# Patient Record
Sex: Female | Born: 1955 | Race: White | Hispanic: No | Smoking: Never smoker
Health system: Southern US, Community
[De-identification: ages and names within clinical notes are randomized; demographics above are authoritative.]

## PROBLEM LIST (undated history)

## (undated) DIAGNOSIS — E785 Hyperlipidemia, unspecified: Secondary | ICD-10-CM

## (undated) DIAGNOSIS — M1990 Unspecified osteoarthritis, unspecified site: Secondary | ICD-10-CM

## (undated) DIAGNOSIS — Z8619 Personal history of other infectious and parasitic diseases: Secondary | ICD-10-CM

## (undated) DIAGNOSIS — Z860100 Personal history of colon polyps, unspecified: Secondary | ICD-10-CM

## (undated) DIAGNOSIS — M8580 Other specified disorders of bone density and structure, unspecified site: Secondary | ICD-10-CM

## (undated) DIAGNOSIS — I10 Essential (primary) hypertension: Secondary | ICD-10-CM

## (undated) HISTORY — DX: Hyperlipidemia, unspecified: E78.5

## (undated) HISTORY — DX: Personal history of colon polyps, unspecified: Z86.0100

## (undated) HISTORY — DX: Other specified disorders of bone density and structure, unspecified site: M85.80

## (undated) HISTORY — PX: TOTAL KNEE ARTHROPLASTY: SHX125

## (undated) HISTORY — DX: Personal history of other infectious and parasitic diseases: Z86.19

## (undated) HISTORY — DX: Essential (primary) hypertension: I10

## (undated) HISTORY — PX: TUBAL LIGATION: SHX77

## (undated) HISTORY — DX: Unspecified osteoarthritis, unspecified site: M19.90

## (undated) HISTORY — DX: Personal history of colonic polyps: Z86.010

## (undated) HISTORY — PX: COLONOSCOPY: SHX174

## (undated) HISTORY — PX: JOINT REPLACEMENT: SHX530

---

## 1997-06-13 HISTORY — PX: PATELLAR TENDON REPAIR: SHX737

## 2005-10-11 ENCOUNTER — Ambulatory Visit: Payer: Self-pay | Admitting: Internal Medicine

## 2006-07-13 ENCOUNTER — Ambulatory Visit: Payer: Self-pay | Admitting: Internal Medicine

## 2006-08-24 ENCOUNTER — Ambulatory Visit: Payer: Self-pay | Admitting: Internal Medicine

## 2006-08-24 LAB — CONVERTED CEMR LAB
Cholesterol: 231 mg/dL (ref 0–200)
Glucose, Bld: 89 mg/dL (ref 70–99)
HDL: 52.3 mg/dL (ref >39.0)
Total CHOL/HDL Ratio: 4.4
VLDL: 20 mg/dL (ref 0–40)

## 2006-09-11 ENCOUNTER — Ambulatory Visit: Payer: Self-pay | Admitting: Gastroenterology

## 2006-09-19 ENCOUNTER — Ambulatory Visit: Payer: Self-pay | Admitting: Gastroenterology

## 2006-09-19 ENCOUNTER — Encounter (INDEPENDENT_AMBULATORY_CARE_PROVIDER_SITE_OTHER): Payer: Self-pay | Admitting: Specialist

## 2007-10-04 ENCOUNTER — Encounter: Payer: Self-pay | Admitting: Internal Medicine

## 2007-10-05 ENCOUNTER — Ambulatory Visit: Payer: Self-pay | Admitting: Internal Medicine

## 2007-10-05 DIAGNOSIS — R079 Chest pain, unspecified: Secondary | ICD-10-CM | POA: Insufficient documentation

## 2007-10-05 DIAGNOSIS — Z86010 Personal history of colonic polyps: Secondary | ICD-10-CM | POA: Insufficient documentation

## 2007-10-05 DIAGNOSIS — M1990 Unspecified osteoarthritis, unspecified site: Secondary | ICD-10-CM | POA: Insufficient documentation

## 2007-10-05 DIAGNOSIS — E785 Hyperlipidemia, unspecified: Secondary | ICD-10-CM | POA: Insufficient documentation

## 2007-11-27 ENCOUNTER — Ambulatory Visit: Payer: Self-pay | Admitting: Internal Medicine

## 2007-11-28 LAB — CONVERTED CEMR LAB
ALT: 16 units/L (ref 0–35)
Albumin: 3.9 g/dL (ref 3.5–5.2)
BUN: 15 mg/dL (ref 6–23)
Basophils Relative: 0.3 % (ref 0.0–1.0)
Bilirubin, Direct: 0.1 mg/dL (ref 0.0–0.3)
Eosinophils Absolute: 0.1 10*3/uL (ref 0.0–0.7)
Eosinophils Relative: 1.9 % (ref 0.0–5.0)
GFR calc Af Amer: 113 mL/min
GFR calc non Af Amer: 93 mL/min
HCT: 40 % (ref 36.0–46.0)
Hemoglobin: 14 g/dL (ref 12.0–15.0)
MCV: 92.8 fL (ref 78.0–100.0)
Monocytes Absolute: 0.5 10*3/uL (ref 0.1–1.0)
Monocytes Relative: 7.2 % (ref 3.0–12.0)
Neutro Abs: 3.9 10*3/uL (ref 1.4–7.7)
Phosphorus: 3.5 mg/dL (ref 2.3–4.6)
Platelets: 249 10*3/uL (ref 150–400)
Potassium: 4 meq/L (ref 3.5–5.1)
RBC: 4.31 M/uL (ref 3.87–5.11)
TSH: 0.81 microintl units/mL (ref 0.35–5.50)
Total CHOL/HDL Ratio: 3.9
Total Protein: 6.9 g/dL (ref 6.0–8.3)
WBC: 7 10*3/uL (ref 4.5–10.5)

## 2008-04-24 ENCOUNTER — Ambulatory Visit: Payer: Self-pay | Admitting: Gynecology

## 2008-04-24 ENCOUNTER — Other Ambulatory Visit: Admission: RE | Admit: 2008-04-24 | Discharge: 2008-04-24 | Payer: Self-pay | Admitting: Gynecology

## 2008-04-24 ENCOUNTER — Encounter: Payer: Self-pay | Admitting: Gynecology

## 2008-04-24 ENCOUNTER — Encounter: Payer: Self-pay | Admitting: Internal Medicine

## 2008-04-30 ENCOUNTER — Encounter: Payer: Self-pay | Admitting: Gynecology

## 2008-04-30 ENCOUNTER — Ambulatory Visit: Payer: Self-pay | Admitting: Gynecology

## 2008-05-14 ENCOUNTER — Ambulatory Visit: Payer: Self-pay | Admitting: Gynecology

## 2008-05-27 ENCOUNTER — Ambulatory Visit: Payer: Self-pay | Admitting: Gynecology

## 2008-06-17 ENCOUNTER — Ambulatory Visit: Payer: Self-pay | Admitting: Gynecology

## 2008-06-18 ENCOUNTER — Ambulatory Visit: Payer: Self-pay | Admitting: Gynecology

## 2008-06-19 ENCOUNTER — Ambulatory Visit (HOSPITAL_BASED_OUTPATIENT_CLINIC_OR_DEPARTMENT_OTHER): Admission: RE | Admit: 2008-06-19 | Discharge: 2008-06-19 | Payer: Self-pay | Admitting: Gynecology

## 2008-06-19 ENCOUNTER — Encounter: Payer: Self-pay | Admitting: Gynecology

## 2008-06-19 ENCOUNTER — Ambulatory Visit: Payer: Self-pay | Admitting: Gynecology

## 2008-07-03 ENCOUNTER — Ambulatory Visit: Payer: Self-pay | Admitting: Gynecology

## 2008-07-09 ENCOUNTER — Ambulatory Visit: Payer: Self-pay | Admitting: Family Medicine

## 2008-10-17 ENCOUNTER — Encounter: Payer: Self-pay | Admitting: Internal Medicine

## 2008-10-17 ENCOUNTER — Ambulatory Visit: Payer: Self-pay | Admitting: Gynecology

## 2008-10-28 ENCOUNTER — Ambulatory Visit: Payer: Self-pay | Admitting: Internal Medicine

## 2008-11-21 ENCOUNTER — Ambulatory Visit: Payer: Self-pay | Admitting: Family Medicine

## 2009-01-21 ENCOUNTER — Ambulatory Visit: Payer: Self-pay | Admitting: Internal Medicine

## 2009-01-21 DIAGNOSIS — H9319 Tinnitus, unspecified ear: Secondary | ICD-10-CM | POA: Insufficient documentation

## 2009-04-16 ENCOUNTER — Encounter: Payer: Self-pay | Admitting: Internal Medicine

## 2009-04-22 ENCOUNTER — Encounter: Payer: Self-pay | Admitting: Internal Medicine

## 2009-04-29 ENCOUNTER — Ambulatory Visit: Payer: Self-pay | Admitting: Internal Medicine

## 2009-04-29 DIAGNOSIS — N951 Menopausal and female climacteric states: Secondary | ICD-10-CM | POA: Insufficient documentation

## 2009-05-01 LAB — CONVERTED CEMR LAB
ALT: 18 units/L (ref 0–35)
AST: 17 units/L (ref 0–37)
Albumin: 4.2 g/dL (ref 3.5–5.2)
Alkaline Phosphatase: 70 units/L (ref 39–117)
Basophils Relative: 0.9 % (ref 0.0–3.0)
Bilirubin, Direct: 0.1 mg/dL (ref 0.0–0.3)
CO2: 29 meq/L (ref 19–32)
Chloride: 104 meq/L (ref 96–112)
GFR calc non Af Amer: 79.58 mL/min (ref >60)
HCT: 42.9 % (ref 36.0–46.0)
Hemoglobin: 14.5 g/dL (ref 12.0–15.0)
Lymphocytes Relative: 33.8 % (ref 12.0–46.0)
Monocytes Relative: 6.1 % (ref 3.0–12.0)
Neutro Abs: 4.8 10*3/uL (ref 1.4–7.7)
Phosphorus: 3.6 mg/dL (ref 2.3–4.6)
Potassium: 4.2 meq/L (ref 3.5–5.1)
RBC: 4.52 M/uL (ref 3.87–5.11)
Total CHOL/HDL Ratio: 4
Total Protein: 7.2 g/dL (ref 6.0–8.3)
VLDL: 19 mg/dL (ref 0.0–40.0)

## 2009-07-29 ENCOUNTER — Ambulatory Visit: Payer: Self-pay | Admitting: Gynecology

## 2009-07-29 ENCOUNTER — Other Ambulatory Visit: Admission: RE | Admit: 2009-07-29 | Discharge: 2009-07-29 | Payer: Self-pay | Admitting: Gynecology

## 2010-01-07 ENCOUNTER — Ambulatory Visit: Payer: Self-pay | Admitting: Family Medicine

## 2010-05-03 ENCOUNTER — Ambulatory Visit: Payer: Self-pay | Admitting: Internal Medicine

## 2010-05-03 DIAGNOSIS — J069 Acute upper respiratory infection, unspecified: Secondary | ICD-10-CM | POA: Insufficient documentation

## 2010-07-13 NOTE — Assessment & Plan Note (Signed)
Summary: ST,PAIN RIGHT EAR/CLE   Vital Signs:  Patient profile:   55 year old female Height:      67 inches Weight:      215.50 pounds BMI:     33.87 Temp:     98.3 degrees F oral Pulse rate:   84 / minute Pulse rhythm:   regular BP sitting:   138 / 92  (right arm) Cuff size:   large  Vitals Entered By: Linde Gillis CMA Duncan Dull) (January 07, 2010 2:43 PM) CC:  right ear pain   History of Present Illness: 55 yo with right ear pain. Swimming a lot this summer, right ear hurts to touch and now has sharp shooting pains for past 4 days. No other URI symptoms other than mildly sore throat. No runny nose, fever, cough.    Current Medications (verified): 1)  Pepcid Ac 10 Mg  Tabs (Famotidine) .Marland Kitchen.. 1 Tab By Mouth Daily Prn 2)  Caltrate 600 1500 Mg Tabs (Calcium Carbonate) .... Take One By Mouth Daily 3)  Fish Oil 1200 Mg Caps (Omega-3 Fatty Acids) .... Take 2 By Mouth Daily 4)  Ciprodex 0.3-0.1 % Susp (Ciprofloxacin-Dexamethasone) .... 4 Drops in Affected Ear Two Times A Day  Allergies (verified): No Known Drug Allergies  Review of Systems      See HPI General:  Denies chills and fever. ENT:  Complains of earache; denies decreased hearing and ear discharge. Resp:  Denies cough.  Physical Exam  General:  alert and normal appearance.   Afebrile, nontoxic appearing Ears:  R tragus tender and R canal inflamed.   TMs normal bilaterally Mouth:  no erythema and no lesions.   Lungs:  normal respiratory effort and normal breath sounds.   Heart:  normal rate, regular rhythm, no murmur, and no gallop.   Extremities:  no edema Psych:  normally interactive, good eye contact, not anxious appearing, and not depressed appearing.     Impression & Recommendations:  Problem # 1:  OTITIS EXTERNA, ACUTE (ICD-380.12) Assessment New Ciprodex x 7 days, also discussed swimming precautions. Her updated medication list for this problem includes:    Ciprodex 0.3-0.1 % Susp  (Ciprofloxacin-dexamethasone) .Marland KitchenMarland KitchenMarland KitchenMarland Kitchen 4 drops in affected ear two times a day  Complete Medication List: 1)  Pepcid Ac 10 Mg Tabs (Famotidine) .Marland Kitchen.. 1 tab by mouth daily prn 2)  Caltrate 600 1500 Mg Tabs (Calcium carbonate) .... Take one by mouth daily 3)  Fish Oil 1200 Mg Caps (Omega-3 fatty acids) .... Take 2 by mouth daily 4)  Ciprodex 0.3-0.1 % Susp (Ciprofloxacin-dexamethasone) .... 4 drops in affected ear two times a day Prescriptions: CIPRODEX 0.3-0.1 % SUSP (CIPROFLOXACIN-DEXAMETHASONE) 4 drops in affected ear two times a day  #1 x 0   Entered and Authorized by:   Ruthe Mannan MD   Signed by:   Ruthe Mannan MD on 01/07/2010   Method used:   [facility]        [facility] [facility]retail[facility]       [address]       [place], [place]  [address]       Ph: [phone] or [phone]       Fax: [phone]   RxID:   [id number]   Current Allergies (reviewed today): No known allergies

## 2010-07-13 NOTE — Assessment & Plan Note (Signed)
Summary: Tanya Gomez,EAR/CLE   Vital Signs:  Patient profile:   55 year old female Weight:      220.75 pounds Temp:     98.6 degrees F oral Pulse rate:   84 / minute Pulse rhythm:   regular BP sitting:   156 / 80  (left arm) Cuff size:   large  Vitals Entered By: Selena Batten Dance CMA Duncan Dull) (May 03, 2010 2:09 PM) CC: Sore throat and Right ear pain   History of Present Illness: CC: Tanya Gomez, ear ache  7d h/o R ear pain, Tanya Gomez.  + dry cough.  Mild congestion initially, but not anymore.   + some dizziness if bends over and stands back up fast.  No hearing changes or ringing in ears, draining from ears.  tried mucinex and alkaseltzer cold plus.    No fevers/chills, abd pain, n/v/d, rashes, myalgias, arthralgias  No sick contacts at home.  + granddaughter sick.  No smokers at home.  bp up today, but not feeling great.  never told has HTN in past.  Current Medications (verified): 1)  Pepcid Ac 10 Mg  Tabs (Famotidine) .Marland Kitchen.. 1 Tab By Mouth Daily Prn 2)  Caltrate 600 1500 Mg Tabs (Calcium Carbonate) .... Take One By Mouth Daily 3)  Fish Oil 1200 Mg Caps (Omega-3 Fatty Acids) .... 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   Afebrile, nontoxic appearing Head:  Normocephalic and atraumatic without obvious abnormalities. No apparent alopecia or balding.  sinuses NT Eyes:  PERRLA, EOMI, no injection Ears:  TMs not erythematous or bulging.  + fluid behind ears bilaterally.  R canal with some cotton strands very near TM, unable to remove w/ curette, no alligator forceps available Nose:  External nasal  examination shows no deformity or inflammation. Nasal mucosa are pink and moist without lesions or exudates. Mouth:  Oral mucosa and oropharynx without lesions or exudates.  Teeth in good repair.  MMM Neck:  No deformities, masses, or tenderness noted.  no LAD Lungs:  Normal respiratory effort, chest expands symmetrically. Lungs are clear to auscultation, no crackles or wheezes. Heart:  Normal rate and regular rhythm. S1 and S2 normal without gallop, murmur, click, rub or other extra sounds. Pulses:  2+ rad pulses   Impression & Recommendations:  Problem # 1:  VIRAL URI (ICD-465.9) Tanya Gomez, but no LAD, fevers.  + cough.  likely viral treat supportively.  claritin D for decongestant trial, to call us if ear pain not improving, consider referral to ENT for visualization and removal of cotton although doubt causing sxs.  expect to improve with time.  Her updated medication list for this problem includes:    Claritin-d 24 Hour 10-240 Mg Xr24h-tab (Loratadine-pseudoephedrine) ..... One daily for decongestant.  Complete Medication List: 1)  Pepcid Ac 10 Mg Tabs (Famotidine) .Marland Kitchen.. 1 tab by mouth daily prn 2)  Caltrate 600 1500 Mg Tabs (Calcium carbonate) .... Take one by mouth daily 3)  Fish Oil 1200 Mg Caps (Omega-3 fatty acids) .... Take 2 by mouth daily 4)  Claritin-d 24 Hour 10-240 Mg Xr24h-tab (Loratadine-pseudoephedrine) .... One daily for decongestant.  Patient Instructions: 1)  Sounds like you  have a viral upper respiratory infection. 2)  Antibiotics are not needed for this.  Viral infections usually take 7-10 days to resolve.  The cough can last up to 4 weeks to go away. 3)  Continue alkaseltzer plus.  trial of antihistamine/decongestant. 4)  Let us know if ear pain not improving as expected. 5)  Please return if you are not improving as expected, or if you have high fevers (>101.5) or difficulty breathing/swalowing 6)  Call clinic with questions.  Pleasure to see you today!   Prescriptions: CLARITIN-D 24 HOUR 10-240 MG XR24H-TAB (LORATADINE-PSEUDOEPHEDRINE) one daily for decongestant.  #30 x 0   Entered and Authorized by:   Eustaquio Boyden  MD   Signed by:   Eustaquio Boyden  MD on 05/03/2010   Method used:   [facility]        [id number]       [address]       [place], [place]  [address]       Ph: [phone] or [phone]       Fax: [phone]   RxID:   [id number]    Orders Added: 1)  Est. Patient Level III [36644]    Current Allergies (reviewed today): No known allergies

## 2010-08-12 LAB — HM MAMMOGRAPHY: HM Mammogram: NORMAL

## 2010-10-04 ENCOUNTER — Encounter (INDEPENDENT_AMBULATORY_CARE_PROVIDER_SITE_OTHER): Payer: PRIVATE HEALTH INSURANCE | Admitting: Gynecology

## 2010-10-04 ENCOUNTER — Other Ambulatory Visit (HOSPITAL_COMMUNITY)
Admission: RE | Admit: 2010-10-04 | Discharge: 2010-10-04 | Disposition: A | Discharge status: Home / Self Care | Payer: PRIVATE HEALTH INSURANCE | Source: Ambulatory Visit | Attending: Gynecology | Admitting: Gynecology

## 2010-10-04 ENCOUNTER — Other Ambulatory Visit: Payer: Self-pay | Admitting: Gynecology

## 2010-10-04 DIAGNOSIS — R635 Abnormal weight gain: Secondary | ICD-10-CM

## 2010-10-04 DIAGNOSIS — Z1211 Encounter for screening for malignant neoplasm of colon: Secondary | ICD-10-CM

## 2010-10-04 DIAGNOSIS — Z01419 Encounter for gynecological examination (general) (routine) without abnormal findings: Secondary | ICD-10-CM | POA: Insufficient documentation

## 2010-10-04 DIAGNOSIS — Z13220 Encounter for screening for lipoid disorders: Secondary | ICD-10-CM

## 2010-10-07 ENCOUNTER — Encounter (INDEPENDENT_AMBULATORY_CARE_PROVIDER_SITE_OTHER): Payer: PRIVATE HEALTH INSURANCE

## 2010-10-07 DIAGNOSIS — M899 Disorder of bone, unspecified: Secondary | ICD-10-CM

## 2010-10-07 DIAGNOSIS — E7800 Pure hypercholesterolemia, unspecified: Secondary | ICD-10-CM

## 2010-10-07 DIAGNOSIS — M949 Disorder of cartilage, unspecified: Secondary | ICD-10-CM

## 2010-10-08 ENCOUNTER — Encounter: Payer: Self-pay | Admitting: Internal Medicine

## 2010-10-11 ENCOUNTER — Ambulatory Visit (INDEPENDENT_AMBULATORY_CARE_PROVIDER_SITE_OTHER): Payer: PRIVATE HEALTH INSURANCE | Admitting: Internal Medicine

## 2010-10-11 ENCOUNTER — Encounter: Payer: Self-pay | Admitting: Internal Medicine

## 2010-10-11 VITALS — BP 150/80 | HR 84 | Temp 98.5°F | Ht 67.0 in | Wt 208.0 lb

## 2010-10-11 DIAGNOSIS — E785 Hyperlipidemia, unspecified: Secondary | ICD-10-CM

## 2010-10-11 DIAGNOSIS — I10 Essential (primary) hypertension: Secondary | ICD-10-CM | POA: Insufficient documentation

## 2010-10-11 NOTE — Patient Instructions (Signed)
Please come in for blood work in about 1 month (met b--401.9)

## 2010-10-11 NOTE — Progress Notes (Signed)
  Subjective:    Patient ID: Tanya Gomez, female    DOB: [birth year], 55 y.o.   MRN: [id number]  HPI Saw Dr Lily Peer for routine visit BP 148/102 and stayed mildly elevated over several checks Diagnosed as HTN and started on low dose HCTZ Chol 211---recheck fasting and was told it was still elevated  Not working on exercise Has gained some weight and is restarting Weight Watchers  No chest pain No SOB No sig headaches No edema  Current outpatient prescriptions:Calcium Carbonate (CALTRATE 600 PO), Take 1 tablet by mouth daily.  , Disp: , Rfl: ;  famotidine (PEPCID) 10 MG tablet, Take 10 mg by mouth at bedtime as needed.  , Disp: , Rfl: ;  hydrochlorothiazide (,MICROZIDE/HYDRODIURIL,) 12.5 MG capsule, Take 1 tablet once daily, Disp: , Rfl: ;  loratadine-pseudoephedrine (CLARITIN-D 24-HOUR) 10-240 MG per 24 hr tablet, Take 1 tablet by mouth daily.  , Disp: , Rfl:  Omega-3 Fatty Acids (FISH OIL) 1200 MG CAPS, Take 2 capsules by mouth daily.  , Disp: , Rfl:   Past Medical History  Diagnosis Date  . Hx of colonic polyps     adenomatous  . Hyperlipidemia   . Osteoarthritis     Past Surgical History  Procedure Date  . Vaginal delivery     x2  . Tubal ligation   . Patellar tendon repair 1999    left, spurs removed    Family History  Problem Relation Age of Onset  . Diabetes Mother   . Stroke Mother   . Peripheral vascular disease Mother   . Coronary artery disease Father   . Diabetes Father   . Heart disease Father   . Brain cancer Maternal Grandmother   . Hypertension Neg Hx   . Hearing loss Neg Hx     History   Social History  . Marital Status: Married    Spouse Name: N/A    Number of Children: 2  . 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 Smoking status: Never Smoker   . Smokeless tobacco: Never Used  . Alcohol Use: Yes     occasional  . Drug Use: No  . Sexually Active:  Not on file   Other Topics Concern  . Not on file   Social History Narrative  . No narrative on file   Review of Systems Sleeps okay No mood problems Is a little anxious about BP since Dad had major stroke at 51    Objective:   Physical Exam  Constitutional: She appears well-developed and well-nourished. No distress.  Neck: Normal range of motion. Neck supple. No thyromegaly present.       No carotid bruits  Cardiovascular: Normal rate, regular rhythm, normal heart sounds and intact distal pulses.  Exam reveals no gallop.   No murmur heard. Pulmonary/Chest: Effort normal and breath sounds normal. No respiratory distress. She has no wheezes. She has no rales.  Musculoskeletal: Normal range of motion. She exhibits no edema and no tenderness.  Lymphadenopathy:    She has no cervical adenopathy.  Psychiatric: She has a normal mood and affect. Her behavior is normal. Judgment and thought content normal.          Assessment & Plan:

## 2010-10-13 ENCOUNTER — Encounter: Payer: Self-pay | Admitting: Internal Medicine

## 2010-10-26 NOTE — Op Note (Signed)
NAME:  LASHE, OLIVEIRA              ACCOUNT NO.:  [id number]   MEDICAL RECORD NO.:  [id number]          PATIENT TYPE:  AMB   LOCATION:  [facility]                         FACILITY:  [facility]   PHYSICIAN:  Juan H. Lily Peer, M.D.DATE OF BIRTH:  09/21/1955   DATE OF PROCEDURE:  DATE OF DISCHARGE:                               OPERATIVE REPORT   SURGEON:  Juan H. Lily Peer, MD   INDICATIONS FOR OPERATION:  A 55 year old gravida 2, para 2 with  dysfunctional uterine bleeding and a prolapse submucous myoma.  Sonohysterogram demonstrated additional submucous myomas inside the  intrauterine cavity.  Her endometrial biopsy had remained that reported  a benign endometrial polyp.  The patient is scheduled to undergo  resectoscope myomectomy and polypectomy.   PREOPERATIVE DIAGNOSES:  1. Prolapsing submucous myoma.  2. Endometrial polyps.  3. Submucous myoma.   POSTOPERATIVE DIAGNOSES:  1. Prolapsing submucous myoma.  2. Endometrial polyps.  3. Submucous myoma.   ANESTHESIA:  General endotracheal anesthesia.   PROCEDURE PERFORMED:  1. Diagnostic hysteroscopy.  2. Resectoscopic submucous myomectomy.  3. Resectoscopic polypectomy.   FINDINGS:  The patient had a prolapse anterior uterine wall submucous  myoma and small endometrial polyps found on the fundal region of the  uterus.   DESCRIPTION OF OPERATION:  After the patient was adequately counseled,  she was taken to the operating room where she underwent a successful  general endotracheal anesthesia.  The patient had PSA stockings for DVT  prophylaxis.  She had received a gram of cefotetan preoperatively.  After general endotracheal anesthesia was obtained, she was placed in  high lithotomy position.  The previously placed laminaria was removed  from the cervix.  The vagina, cervix, and perineum were prepped and  draped in usual sterile fashion.  A red rubber Roxan Hockey was used to  evacuate the bladder of its contents for approximately 50  mL to 75 mL.  Bimanual examination demonstrated upper limits of normal anteverted  uterus.  A short-weighted billed speculum was placed in posterior  vaginal wall; the same is retracting the anterior vaginal vault.  The  single-tooth tenaculum was placed in the anterior cervical lip.  The  uterus was found approximately 8 cm required no cervical dilatation.  It  was evident that a prolapsed submucous myoma was present.  With the use  of a ring forceps, it was twisted off its pedicle and passed off for  histological evaluation.  The Lendell Caprice operative resectoscope with a  90 degrees wire loop was introduced into the intrauterine cavity; 3%  sorbitol was in sustained media.  The [facility] surgical  generator was set at 80 watts in a cutting mode and 80 watts on the  coagulation mode, distending media pressure was 100 mmHg.  The operative  resectoscope was introduced into the intrauterine cavity.  The base of  the prolapse submucous myoma was identified in the posterior aspect  lower uterine segment and was transected and remaining stump was excised  and passed off the operative field for histological evaluation.  An  additional submucous myoma was noted on the anterior uterine wall which  was excised with a resectoscope and passed off the operative field for  histological evaluation.  Scattered endometrial polyps and endometrial  cavity were also resected and passed off the operative field as well.  The operating element was switched to a VaporTrode to the bleeding that  was present in a small vessels, and to contain that the VaporTrode then  was set at 100 watts in the coagulation mode and the bleeders were  cauterized.  Preprocedure and postprocedure pictures were obtained.  The  patient tolerated the procedure well.  She was extubated and transferred  to recovery room with stable vital signs.   ESTIMATED BLOOD LOSS:  Minimal.   FLUID RESUSCITATION:  Fluid resuscitation  consisted of 800 mL of  lactated Ringer's.  Fluid deficit of 3% sorbitol was 85 mL.      Juan H. Lily Peer, M.D.  Electronically Signed     JHF/MEDQ  D:  06/19/2008  T:  06/19/2008  Job:  [id number]

## 2010-10-26 NOTE — H&P (Signed)
NAME:  Tanya Gomez, Tanya Gomez              ACCOUNT NO.:  [id number]   MEDICAL RECORD NO.:  [id number]          PATIENT TYPE:  AMB   LOCATION:  [facility]                         [facility]ACILITY[facility]  [facility]   PHYSICIAN:  Juan H. Lily Peer, M.D.DATE OF BIRTH:  12-Jul-1955   DATE OF ADMISSION:  DATE OF DISCHARGE:                              HISTORY & PHYSICAL   The patient is scheduled for surgery on Thursday, January 7th at 08:45  at [facility].  Please have history and physical  available.   CHIEF COMPLAINT:  Submucous myoma and intrauterine polyps.   HISTORY OF PRESENT ILLNESS:  The patient is a 55 year old gravida 2,  para 2 who was seen in the office on November 12th for her annual  gynecological examination as a new patient.  She had been complaining of  a watery discharge and Blood-tinged especially after intercourse.  During examination, it appeared that she had a prolapsed submucous myoma  or polyps.  She returned back to the office.  She underwent a resection  with the [facility] lab electrical surgical generator with a LEEP Unit on  November 18th and a pathology report came benign endometrial polyp.  She  returned for sonohysterogram and it appears she had a second prolapsing  submucous myoma to the level of the external cervical os and  sonohysterogram was done.  She had several intramural myoma, the large  one measuring 12 mm in size.  She had a prominent vascular endometrium;  heterogeneous myometrium suggesting adenomyosis.  Both ovaries were  normal.  The sonohysterogram demonstrated a fundal echogenic focus,  anterior uterine wall measuring 19 mm x 16 mm x 9 mm.  The patient is  scheduled to undergo diagnostic hysteroscopy and resectoscope  polypectomy and myomectomy.   PAST MEDICAL HISTORY:  She has had a prior tubal sterilization procedure  and has had knee surgery as well.   ALLERGIES:  She had denies any allergies.   FAMILY HISTORY:  Diabetes in both parents and husband.   Mother with  hypertension.  Father with cardiovascular disease.   PHYSICAL EXAMINATION:  VITAL SIGNS:  The patient weighs 222 pounds, she  is 5 feet 6-3/4 inches tall, and blood pressure 148/94 on the visit of  November 12th.  HEENT:  Unremarkable.  NECK:  Supple.  Trachea midline.  No carotid bruits.  No thyromegaly.  LUNGS:  Clear to auscultation without rhonchi or wheezes.  HEART:  Regular rate and rhythm.  No murmurs or gallop.  BREASTS:  Exam not done.  ABDOMEN:  Soft and nontender.  No rebound or guarding.  PELVIC:  Bartholin, urethra, and Skene's are in normal limits.  VAGINA AND CERVIX:  The only gross lesion was a prolapsing submucous  myoma.  Bimanual examination was otherwise unremarkable.  RECTAL:  Exam not done.   ASSESSMENT:  A 55 year old gravida 2, para 2 with prolapsing submucous  myoma additional endometrial polyp versus submucous myoma was noted on  sonohysterogram.  The patient is scheduled to undergo diagnostic  hysteroscopy with resectoscope polypectomy and/or myomectomy.  She  underwent placement of laminaria intracervically today to facilitate  insertion  of the operative hysteroscope on the day of her surgery.  She  was given a prescription for pain medication such as Lortab 7.5/500 one  p.o. q. 4-6h p.r.n. pain and Reglan 10 mg one p.o. q. 4-6h p.r.n. nausea  or vomiting.  The risks, benefits, and pros and cons of the procedure  were discussed in detail with the patient.  All questions were answered  and we will follow accordingly.   PLAN:  The patient is scheduled for diagnostic hysteroscopy,  resectoscopic polypectomy, and/or submucous myomectomy on Thursday,  January 7th at 08:45 a.m. at [facility].  Please have  history and physical available.      Juan H. Lily Peer, M.D.  Electronically Signed     JHF/MEDQ  D:  06/18/2008  T:  06/18/2008  Job:  [id number]

## 2010-11-05 ENCOUNTER — Inpatient Hospital Stay (INDEPENDENT_AMBULATORY_CARE_PROVIDER_SITE_OTHER)
Admission: RE | Admit: 2010-11-05 | Discharge: 2010-11-05 | Disposition: A | Discharge status: Home / Self Care | Payer: PRIVATE HEALTH INSURANCE | Source: Ambulatory Visit | Attending: Family Medicine | Admitting: Family Medicine

## 2010-11-05 DIAGNOSIS — H9209 Otalgia, unspecified ear: Secondary | ICD-10-CM

## 2010-11-11 ENCOUNTER — Other Ambulatory Visit: Payer: Self-pay | Admitting: Internal Medicine

## 2010-11-11 ENCOUNTER — Other Ambulatory Visit (INDEPENDENT_AMBULATORY_CARE_PROVIDER_SITE_OTHER): Payer: PRIVATE HEALTH INSURANCE | Admitting: Internal Medicine

## 2010-11-11 DIAGNOSIS — I10 Essential (primary) hypertension: Secondary | ICD-10-CM

## 2010-11-11 LAB — BASIC METABOLIC PANEL
GFR: 80.28 mL/min (ref >60.00)
Glucose, Bld: 90 mg/dL (ref 70–99)
Potassium: 4.5 mEq/L (ref 3.5–5.1)
Sodium: 141 mEq/L (ref 135–145)

## 2011-01-31 ENCOUNTER — Other Ambulatory Visit: Payer: Self-pay | Admitting: Gynecology

## 2011-01-31 MED ORDER — HYDROCHLOROTHIAZIDE 12.5 MG PO CAPS: 12.5000 mg | ORAL_CAPSULE | ORAL | Status: DC

## 2011-01-31 NOTE — Telephone Encounter (Signed)
Pt informed with the below re:rx and to follow up with dr Alphonsus Sias office for hypertension.

## 2011-01-31 NOTE — Telephone Encounter (Signed)
Message copied by Aura Camps on Mon Jan 31, 2011  9:09 AM ------      Message from: Ok Edwards      Created: Mon Jan 31, 2011  9:04 AM       Victorino Dike, please tell patient that I will prescribe her one month of the HCTZ but she needs to followup with her primary physician Dr.Letvak for her chronic hypertension. Dr. Lily Peer

## 2011-03-01 ENCOUNTER — Other Ambulatory Visit: Payer: Self-pay | Admitting: *Deleted

## 2011-03-01 MED ORDER — HYDROCHLOROTHIAZIDE 12.5 MG PO CAPS: ORAL_CAPSULE | ORAL | Status: DC

## 2011-04-12 ENCOUNTER — Encounter: Payer: Self-pay | Admitting: Internal Medicine

## 2011-04-12 ENCOUNTER — Ambulatory Visit (INDEPENDENT_AMBULATORY_CARE_PROVIDER_SITE_OTHER): Payer: PRIVATE HEALTH INSURANCE | Admitting: Internal Medicine

## 2011-04-12 VITALS — BP 138/80 | HR 90 | Temp 98.1°F | Ht 67.0 in | Wt 205.0 lb

## 2011-04-12 DIAGNOSIS — Z23 Encounter for immunization: Secondary | ICD-10-CM

## 2011-04-12 DIAGNOSIS — Z0000 Encounter for general adult medical examination without abnormal findings: Secondary | ICD-10-CM | POA: Insufficient documentation

## 2011-04-12 DIAGNOSIS — L409 Psoriasis, unspecified: Secondary | ICD-10-CM | POA: Insufficient documentation

## 2011-04-12 DIAGNOSIS — L408 Other psoriasis: Secondary | ICD-10-CM

## 2011-04-12 DIAGNOSIS — E785 Hyperlipidemia, unspecified: Secondary | ICD-10-CM

## 2011-04-12 DIAGNOSIS — I10 Essential (primary) hypertension: Secondary | ICD-10-CM

## 2011-04-12 LAB — LIPID PANEL
Cholesterol: 227 mg/dL — ABNORMAL HIGH (ref 0–200)
HDL: 62.6 mg/dL (ref >39.00)
Triglycerides: 109 mg/dL (ref 0.0–149.0)
VLDL: 21.8 mg/dL (ref 0.0–40.0)

## 2011-04-12 LAB — CBC WITH DIFFERENTIAL/PLATELET
Basophils Absolute: 0 10*3/uL (ref 0.0–0.1)
Eosinophils Relative: 0.7 % (ref 0.0–5.0)
Monocytes Absolute: 0.7 10*3/uL (ref 0.1–1.0)
Monocytes Relative: 5.8 % (ref 3.0–12.0)
Neutrophils Relative %: 66.3 % (ref 43.0–77.0)
Platelets: 288 10*3/uL (ref 150.0–400.0)
WBC: 11.4 10*3/uL — ABNORMAL HIGH (ref 4.5–10.5)

## 2011-04-12 LAB — HEPATIC FUNCTION PANEL
AST: 17 U/L (ref 0–37)
Alkaline Phosphatase: 69 U/L (ref 39–117)
Total Bilirubin: 0.4 mg/dL (ref 0.3–1.2)

## 2011-04-12 LAB — BASIC METABOLIC PANEL
BUN: 18 mg/dL (ref 6–23)
Calcium: 9.2 mg/dL (ref 8.4–10.5)
Creatinine, Ser: 0.8 mg/dL (ref 0.4–1.2)
GFR: 80.16 mL/min (ref >60.00)

## 2011-04-12 LAB — LDL CHOLESTEROL, DIRECT: Direct LDL: 180.8 mg/dL

## 2011-04-12 LAB — TSH: TSH: 0.48 u[IU]/mL (ref 0.35–5.50)

## 2011-04-12 NOTE — Assessment & Plan Note (Signed)
Mild She will call with name of Rx and I will refill

## 2011-04-12 NOTE — Assessment & Plan Note (Signed)
BP Readings from Last 3 Encounters:  04/12/11 138/80  10/11/10 150/80  05/03/10 156/80   Good control No changes needed  Due for labs

## 2011-04-12 NOTE — Progress Notes (Signed)
Subjective:    Patient ID: Tanya Gomez, female    DOB: April 11, 1956, 55 y.o.   MRN: [id number]  HPI Doing well Did see Dr Lily Peer this year Had Pap and mammogram  Still on the BP med  No problems with this  Wants pertussis vaccine---will be grandmother again  Current Outpatient Prescriptions on File Prior to Visit  Medication Sig Dispense Refill  . Calcium Carbonate (CALTRATE 600 PO) Take 1 tablet by mouth daily.        . famotidine (PEPCID) 10 MG tablet Take 10 mg by mouth at bedtime as needed.        . hydrochlorothiazide (MICROZIDE) 12.5 MG capsule Take one by mouth daily  30 capsule  1  . loratadine-pseudoephedrine (CLARITIN-D 24-HOUR) 10-240 MG per 24 hr tablet Take 1 tablet by mouth daily.        . Omega-3 Fatty Acids (FISH OIL) 1200 MG CAPS Take 2 capsules by mouth daily.          No Known Allergies  Past Medical History  Diagnosis Date  . Hx of colonic polyps     adenomatous  . Hyperlipidemia   . Osteoarthritis   . Hypertension     Past Surgical History  Procedure Date  . Vaginal delivery     x2  . Tubal ligation   . Patellar tendon repair 1999    left, spurs removed    Family History  Problem Relation Age of Onset  . Diabetes Mother   . Stroke Mother   . Peripheral vascular disease Mother   . Coronary artery disease Father   . Diabetes Father   . Heart disease Father   . Brain cancer Maternal Grandmother   . Hypertension Neg Hx   . Hearing loss Neg Hx     History   Social History  . Marital Status: Married    Spouse Name: N/A    Number of Children: 2  . 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 Smoking status: Never Smoker   . Smokeless tobacco: Never Used  . Alcohol Use: Yes     occasional  . Drug Use: No  . Sexually Active: Not on file   Other Topics Concern  . Not on file   Social History Narrative  . No narrative on file   Review of Systems   Constitutional: Positive for activity change. Negative for fatigue.       Weight is down some Trying to do more exercise Wears seat belt  HENT: Positive for tinnitus. Negative for hearing loss, congestion and rhinorrhea.        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       No diplopia or unilateral vision loss  Respiratory: Negative for cough, chest tightness and shortness of breath.   Cardiovascular: Negative for chest pain, palpitations and leg swelling.  Gastrointestinal: Negative for nausea, vomiting, abdominal pain, constipation and blood in stool.       No sig heartburn  Genitourinary: Negative for urgency, frequency, difficulty urinating and dyspareunia.  Musculoskeletal: Negative for back pain, joint swelling and arthralgias.  Skin: Positive for rash.       Psoriasis on elbows and ankles---saw derm. Had cream but ran out. She will call with the name for refill No suspicious lesions  Neurological: Negative for dizziness, syncope, weakness, light-headedness, numbness  and headaches.  Hematological: Negative for adenopathy. Does not bruise/bleed easily.  Psychiatric/Behavioral: Negative for sleep disturbance and dysphoric mood. The patient is not nervous/anxious.        Objective:   Physical Exam  Constitutional: She is oriented to person, place, and time. She appears well-developed and well-nourished. No distress.  HENT:  Head: Normocephalic and atraumatic.  Right Ear: External ear normal.  Left Ear: External ear normal.  Mouth/Throat: Oropharynx is clear and moist. No oropharyngeal exudate.       TMs normal  Eyes: Conjunctivae and EOM are normal. Pupils are equal, round, and reactive to light.       Fundi benign  Neck: Normal range of motion. Neck supple. No thyromegaly present.  Cardiovascular: Normal rate, regular rhythm, normal heart sounds and intact distal pulses.  Exam reveals no  gallop.   No murmur heard. Pulmonary/Chest: Effort normal and breath sounds normal. No respiratory distress. She has no wheezes. She has no rales.  Abdominal: Soft. There is no tenderness.  Musculoskeletal: Normal range of motion. She exhibits no edema and no tenderness.  Lymphadenopathy:    She has no cervical adenopathy.  Neurological: She is alert and oriented to person, place, and time.  Skin: Skin is warm. No rash noted. No erythema.  Psychiatric: She has a normal mood and affect. Her behavior is normal. Judgment and thought content normal.          Assessment & Plan:

## 2011-04-12 NOTE — Assessment & Plan Note (Signed)
Discussed Rx Will hold off on labs but may want to reconsider statins in the future

## 2011-04-12 NOTE — Assessment & Plan Note (Signed)
Generally healthy Working on fitness UTD on cancer screening Flu shot given Did have Tdap in 2008

## 2011-05-02 ENCOUNTER — Other Ambulatory Visit: Payer: Self-pay | Admitting: Internal Medicine

## 2011-08-16 ENCOUNTER — Encounter: Payer: Self-pay | Admitting: Gastroenterology

## 2011-08-18 ENCOUNTER — Telehealth: Payer: Self-pay | Admitting: Internal Medicine

## 2011-08-18 MED ORDER — CLOBETASOL PROPIONATE 0.05 % EX CREA: TOPICAL_CREAM | Freq: Two times a day (BID) | CUTANEOUS | Status: AC | PRN

## 2011-08-18 NOTE — Telephone Encounter (Signed)
Husband in She needs refill of clobetasol 0.05% which she uses occ Will refill

## 2011-10-07 ENCOUNTER — Encounter: Payer: Self-pay | Admitting: Internal Medicine

## 2011-10-07 ENCOUNTER — Ambulatory Visit (INDEPENDENT_AMBULATORY_CARE_PROVIDER_SITE_OTHER): Payer: PRIVATE HEALTH INSURANCE | Admitting: Internal Medicine

## 2011-10-07 VITALS — BP 130/90 | HR 64 | Temp 98.3°F | Wt 216.2 lb

## 2011-10-07 DIAGNOSIS — J019 Acute sinusitis, unspecified: Secondary | ICD-10-CM

## 2011-10-07 MED ORDER — AMOXICILLIN 500 MG PO TABS: 1000.0000 mg | ORAL_TABLET | Freq: Two times a day (BID) | ORAL | Status: AC

## 2011-10-07 NOTE — Progress Notes (Signed)
  Subjective:    Patient ID: Tanya Gomez, female    DOB: [birth year], 56 y.o.   MRN: [id number]  HPI Left ear has been hurting for a couple of weeks Right maxillary pain as well  Uses mucinex sinus max and advil sinus and that gives temporary relief No itchy eyes or nose Some sneezing Notes post nasal drip at night Some cough with swallowed mucus  No fever Sore throat last week---better now No SOB  Current Outpatient Prescriptions on File Prior to Visit  Medication Sig Dispense Refill  . Calcium Carbonate (CALTRATE 600 PO) Take 1 tablet by mouth daily.        . clobetasol cream (TEMOVATE) 0.05 % Apply topically 2 (two) times daily as needed.  30 g  1  . famotidine (PEPCID) 10 MG tablet Take 10 mg by mouth at bedtime as needed.        . hydrochlorothiazide (MICROZIDE) 12.5 MG capsule Take 1 capsule (12.5 mg total) by mouth every morning.  30 capsule  11  . loratadine-pseudoephedrine (CLARITIN-D 24-HOUR) 10-240 MG per 24 hr tablet Take 1 tablet by mouth daily.          No Known Allergies  Past Medical History  Diagnosis Date  . Hx of colonic polyps     adenomatous  . Hyperlipidemia   . Osteoarthritis   . Hypertension     Past Surgical History  Procedure Date  . Vaginal delivery     x2  . Tubal ligation   . Patellar tendon repair 1999    left, spurs removed    Family History  Problem Relation Age of Onset  . Diabetes Mother   . Stroke Mother   . Peripheral vascular disease Mother   . Coronary artery disease Father   . Diabetes Father   . Heart disease Father   . Brain cancer Maternal Grandmother   . Hypertension Neg Hx   . Hearing loss Neg Hx     History   Social History  . Marital Status: Married    Spouse Name: N/A    Number of Children: 2  . 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 Smoking status: Never Smoker   . Smokeless tobacco: Never Used  . Alcohol Use:  Yes     occasional  . Drug Use: No  . Sexually Active: Not on file   Other Topics Concern  . Not on file   Social History Narrative  . No narrative on file   Review of Systems No history of seasonal allergies Weight is up 10# since physical    Objective:   Physical Exam  Constitutional: She appears well-developed and well-nourished. No distress.  HENT:       TMs normal Moderate nasal swelling with some opaque mucus Mild left maxillary tenderness  Neck: Normal range of motion. Neck supple.  Pulmonary/Chest: Effort normal and breath sounds normal. No respiratory distress. She has no wheezes. She has no rales.  Lymphadenopathy:    She has no cervical adenopathy.          Assessment & Plan:

## 2011-10-07 NOTE — Assessment & Plan Note (Signed)
Has had 2 weeks of symptoms Ear pain seems secondary Will treat with amoxil given time course (and son is getting married next week) Continue symptom management

## 2012-04-11 ENCOUNTER — Encounter: Payer: Self-pay | Admitting: Internal Medicine

## 2012-04-11 ENCOUNTER — Ambulatory Visit (INDEPENDENT_AMBULATORY_CARE_PROVIDER_SITE_OTHER): Payer: Self-pay | Admitting: Internal Medicine

## 2012-04-11 VITALS — BP 128/78 | HR 86 | Temp 98.0°F | Ht 67.0 in | Wt 217.0 lb

## 2012-04-11 DIAGNOSIS — I10 Essential (primary) hypertension: Secondary | ICD-10-CM

## 2012-04-11 DIAGNOSIS — E669 Obesity, unspecified: Secondary | ICD-10-CM | POA: Insufficient documentation

## 2012-04-11 DIAGNOSIS — Z0000 Encounter for general adult medical examination without abnormal findings: Secondary | ICD-10-CM

## 2012-04-11 DIAGNOSIS — E785 Hyperlipidemia, unspecified: Secondary | ICD-10-CM

## 2012-04-11 DIAGNOSIS — Z23 Encounter for immunization: Secondary | ICD-10-CM

## 2012-04-11 LAB — BASIC METABOLIC PANEL
BUN: 15 mg/dL (ref 6–23)
Chloride: 106 mEq/L (ref 96–112)
Creatinine, Ser: 0.8 mg/dL (ref 0.4–1.2)
Glucose, Bld: 91 mg/dL (ref 70–99)
Potassium: 3.9 mEq/L (ref 3.5–5.1)

## 2012-04-11 LAB — LIPID PANEL
Cholesterol: 238 mg/dL — ABNORMAL HIGH (ref 0–200)
VLDL: 22 mg/dL (ref 0.0–40.0)

## 2012-04-11 LAB — HEPATIC FUNCTION PANEL
AST: 24 U/L (ref 0–37)
Albumin: 4 g/dL (ref 3.5–5.2)
Total Bilirubin: 0.6 mg/dL (ref 0.3–1.2)

## 2012-04-11 LAB — CBC WITH DIFFERENTIAL/PLATELET
Basophils Absolute: 0.1 10*3/uL (ref 0.0–0.1)
Eosinophils Absolute: 0.1 10*3/uL (ref 0.0–0.7)
HCT: 43.7 % (ref 36.0–46.0)
Lymphs Abs: 2.8 10*3/uL (ref 0.7–4.0)
MCHC: 33.5 g/dL (ref 30.0–36.0)
MCV: 93.3 fl (ref 78.0–100.0)
Monocytes Absolute: 0.6 10*3/uL (ref 0.1–1.0)
Neutrophils Relative %: 57.4 % (ref 43.0–77.0)
Platelets: 277 10*3/uL (ref 150.0–400.0)
RDW: 12.4 % (ref 11.5–14.6)

## 2012-04-11 LAB — TSH: TSH: 0.95 u[IU]/mL (ref 0.35–5.50)

## 2012-04-11 LAB — LDL CHOLESTEROL, DIRECT: Direct LDL: 179.6 mg/dL

## 2012-04-11 NOTE — Assessment & Plan Note (Signed)
Counseled on lifestyle

## 2012-04-11 NOTE — Assessment & Plan Note (Signed)
Discussed primary prevention again If LDL still high (last year 161), will start atorvastatin 20

## 2012-04-11 NOTE — Assessment & Plan Note (Signed)
Generally healthy but really needs to work on fitness and diet Discussed starting Weight Watchers again Gym mammo due next spring

## 2012-04-11 NOTE — Assessment & Plan Note (Signed)
BP Readings from Last 3 Encounters:  04/11/12 128/78  10/07/11 130/90  04/12/11 138/80   Reasonable control No changes needed

## 2012-04-11 NOTE — Progress Notes (Signed)
Subjective:    Patient ID: Tanya Gomez, female    DOB: June 20, 1955, 56 y.o.   MRN: [id number]  HPI Here for physical No new problems  Still sees Dr Lily Peer for gyn Did have normal DEXA already  Was doing some exercise--at gym (class and elliptical) Got off track and hopes to restart No real dietary control--but has done Weight Watchers  Current Outpatient Prescriptions on File Prior to Visit  Medication Sig Dispense Refill  . Calcium Carbonate (CALTRATE 600 PO) Take 1 tablet by mouth daily.        . clobetasol cream (TEMOVATE) 0.05 % Apply topically 2 (two) times daily as needed.  30 g  1  . hydrochlorothiazide (MICROZIDE) 12.5 MG capsule Take 1 capsule (12.5 mg total) by mouth every morning.  30 capsule  11    No Known Allergies  Past Medical History  Diagnosis Date  . Hx of colonic polyps     adenomatous  . Hyperlipidemia   . Osteoarthritis   . Hypertension     Past Surgical History  Procedure Date  . Vaginal delivery     x2  . Tubal ligation   . Patellar tendon repair 1999    left, spurs removed    Family History  Problem Relation Age of Onset  . Diabetes Mother   . Stroke Mother   . Peripheral vascular disease Mother   . Coronary artery disease Father   . Diabetes Father   . Heart disease Father   . Brain cancer Maternal Grandmother   . Hypertension Neg Hx   . Hearing loss Neg Hx     History   Social History  . Marital Status: Married    Spouse Name: N/A    Number of Children: 2  . 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 Smoking status: Never Smoker   . Smokeless tobacco: Never Used  . Alcohol Use: Yes     occasional  . Drug Use: No  . Sexually Active: Not on file   Other Topics Concern  . Not on file   Social History Narrative  . No narrative on file   Review of Systems  Constitutional: Negative for fatigue and unexpected weight change.   Wears seat belt  HENT: Positive for hearing loss, congestion, rhinorrhea and tinnitus.        Chronic static Regular with dentist  Eyes: Negative for visual disturbance.       No diplopia or unilateral vision loss  Respiratory: Negative for cough, chest tightness and shortness of breath.   Cardiovascular: Negative for chest pain, palpitations and leg swelling.  Gastrointestinal: Negative for nausea, vomiting, abdominal pain, constipation and blood in stool.       No heartburn  Genitourinary: Negative for urgency, frequency, difficulty urinating and dyspareunia.       No incontinence  Musculoskeletal: Negative for back pain, joint swelling and arthralgias.       Right 2nd toe lump on DIP  Skin: Positive for rash.       No suspicious areas Still with psoriasis--uses cream on feet  Neurological: Negative for dizziness, syncope, weakness, light-headedness, numbness and headaches.  Hematological: Negative for adenopathy. Does not bruise/bleed easily.  Psychiatric/Behavioral: Negative for disturbed wake/sleep cycle and dysphoric mood. The patient is not nervous/anxious.        Objective:   Physical Exam  Constitutional: She is oriented to person, place, and  time. She appears well-developed and well-nourished. No distress.  HENT:  Head: Normocephalic and atraumatic.  Right Ear: External ear normal.  Left Ear: External ear normal.  Mouth/Throat: Oropharynx is clear and moist. No oropharyngeal exudate.  Eyes: Conjunctivae normal and EOM are normal. Pupils are equal, round, and reactive to light.  Neck: Normal range of motion. Neck supple. No thyromegaly present.  Cardiovascular: Normal rate, regular rhythm, normal heart sounds and intact distal pulses.  Exam reveals no gallop.   No murmur heard. Pulmonary/Chest: Effort normal and breath sounds normal. No respiratory distress. She has no wheezes. She has no rales.  Abdominal: Soft. There is no tenderness.  Musculoskeletal: She exhibits no  edema and no tenderness.  Lymphadenopathy:    She has no cervical adenopathy.  Neurological: She is alert and oriented to person, place, and time.  Skin: No rash noted. No erythema.  Psychiatric: She has a normal mood and affect. Her behavior is normal.          Assessment & Plan:

## 2012-04-11 NOTE — Patient Instructions (Signed)
Please start weight watchers again

## 2012-04-23 ENCOUNTER — Encounter: Payer: Self-pay | Admitting: *Deleted

## 2012-04-23 MED ORDER — ATORVASTATIN CALCIUM 20 MG PO TABS: 20.0000 mg | ORAL_TABLET | Freq: Every day | ORAL | Status: DC

## 2012-05-01 ENCOUNTER — Other Ambulatory Visit: Payer: Self-pay | Admitting: Internal Medicine

## 2012-09-17 ENCOUNTER — Encounter: Payer: Self-pay | Admitting: Gastroenterology

## 2013-03-29 ENCOUNTER — Other Ambulatory Visit: Payer: Self-pay | Admitting: Internal Medicine

## 2013-04-12 ENCOUNTER — Encounter: Payer: Self-pay | Admitting: Internal Medicine

## 2013-04-12 ENCOUNTER — Ambulatory Visit (INDEPENDENT_AMBULATORY_CARE_PROVIDER_SITE_OTHER): Payer: BC Managed Care – PPO | Admitting: Internal Medicine

## 2013-04-12 VITALS — BP 136/90 | HR 76 | Temp 97.8°F | Ht 67.0 in | Wt 217.5 lb

## 2013-04-12 DIAGNOSIS — L408 Other psoriasis: Secondary | ICD-10-CM

## 2013-04-12 DIAGNOSIS — E785 Hyperlipidemia, unspecified: Secondary | ICD-10-CM

## 2013-04-12 DIAGNOSIS — Z23 Encounter for immunization: Secondary | ICD-10-CM

## 2013-04-12 DIAGNOSIS — L409 Psoriasis, unspecified: Secondary | ICD-10-CM

## 2013-04-12 DIAGNOSIS — Z0000 Encounter for general adult medical examination without abnormal findings: Secondary | ICD-10-CM

## 2013-04-12 DIAGNOSIS — I10 Essential (primary) hypertension: Secondary | ICD-10-CM

## 2013-04-12 LAB — BASIC METABOLIC PANEL
BUN: 16 mg/dL (ref 6–23)
CO2: 29 mEq/L (ref 19–32)
Glucose, Bld: 98 mg/dL (ref 70–99)
Potassium: 4.1 mEq/L (ref 3.5–5.1)
Sodium: 139 mEq/L (ref 135–145)

## 2013-04-12 LAB — CBC WITH DIFFERENTIAL/PLATELET
Basophils Absolute: 0 10*3/uL (ref 0.0–0.1)
Eosinophils Absolute: 0.2 10*3/uL (ref 0.0–0.7)
HCT: 43.1 % (ref 36.0–46.0)
Hemoglobin: 14.7 g/dL (ref 12.0–15.0)
Lymphs Abs: 3 10*3/uL (ref 0.7–4.0)
MCHC: 34 g/dL (ref 30.0–36.0)
MCV: 90.9 fl (ref 78.0–100.0)
Monocytes Absolute: 0.7 10*3/uL (ref 0.1–1.0)
Neutro Abs: 5 10*3/uL (ref 1.4–7.7)
Platelets: 298 10*3/uL (ref 150.0–400.0)
RDW: 12 % (ref 11.5–14.6)

## 2013-04-12 LAB — HEPATIC FUNCTION PANEL
Albumin: 4.3 g/dL (ref 3.5–5.2)
Alkaline Phosphatase: 71 U/L (ref 39–117)
Total Protein: 7.7 g/dL (ref 6.0–8.3)

## 2013-04-12 LAB — TSH: TSH: 0.82 u[IU]/mL (ref 0.35–5.50)

## 2013-04-12 LAB — LIPID PANEL
Cholesterol: 144 mg/dL (ref 0–200)
VLDL: 20.4 mg/dL (ref 0.0–40.0)

## 2013-04-12 NOTE — Progress Notes (Signed)
Subjective:    Patient ID: Tanya Gomez, female    DOB: Aug 28, 1955, 57 y.o.   MRN: [id number]  HPI Here for physical  Did start the atorvastatin No problems with this Never got the blood work checked  Doesn't check BP Has restarted Weight Watchers---lost 5# in past month (had gained weight) Trying to walk regularly  Saw dermatologist for her plaque psoriasis on feet and hands Has 2 creams to use  Current Outpatient Prescriptions on File Prior to Visit  Medication Sig Dispense Refill  . atorvastatin (LIPITOR) 20 MG tablet TAKE 1 TABLET BY MOUTH DAILY.  90 tablet  3  . hydrochlorothiazide (MICROZIDE) 12.5 MG capsule TAKE 1 CAPSULE BY MOUTH EVERY MORNING  30 capsule  11   No current facility-administered medications on file prior to visit.    No Known Allergies  Past Medical History  Diagnosis Date  . Hx of colonic polyps     adenomatous  . Hyperlipidemia   . Osteoarthritis   . Hypertension     Past Surgical History  Procedure Laterality Date  . Vaginal delivery      x2  . Tubal ligation    . Patellar tendon repair  1999    left, spurs removed    Family History  Problem Relation Age of Onset  . Diabetes Mother   . Stroke Mother   . Peripheral vascular disease Mother   . Coronary artery disease Father   . Diabetes Father   . Heart disease Father   . Brain cancer Maternal Grandmother   . Hypertension Neg Hx   . Hearing loss Neg Hx     History   Social History  . Marital Status: Married    Spouse Name: N/A    Number of Children: 2  . 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 Smoking status: Never Smoker   . Smokeless tobacco: Never Used  . Alcohol Use: Yes     Comment: occasional  . Drug Use: No  . Sexual Activity: Not on file   Other Topics Concern  . Not on file   Social History Narrative  . No narrative on file   Review of Systems  Constitutional:  Negative for fatigue and unexpected weight change.       Wears seat belt  HENT: Positive for tinnitus. Negative for congestion, dental problem, hearing loss and rhinorrhea.        Right ear tinnitus Regular with dentist  Eyes: Negative for visual disturbance.       No diplopia or unilateral vision loss  Respiratory: Negative for cough, chest tightness and shortness of breath.   Cardiovascular: Negative for chest pain, palpitations and leg swelling.  Gastrointestinal: Negative for nausea, vomiting, abdominal pain, constipation and blood in stool.       No heartburn  Endocrine: Negative for cold intolerance and heat intolerance.  Genitourinary: Negative for dysuria, hematuria, difficulty urinating and dyspareunia.       Sees Dr Lily Peer still  Musculoskeletal: Negative for arthralgias, back pain and joint swelling.  Skin: Positive for rash.       Sees derm   Allergic/Immunologic: Negative for environmental allergies and immunocompromised state.  Neurological: Negative for dizziness, syncope, weakness, light-headedness, numbness and headaches.  Hematological: Negative for adenopathy. Does not bruise/bleed easily.  Psychiatric/Behavioral: Negative for sleep disturbance and dysphoric mood. The patient is not nervous/anxious.  Objective:   Physical Exam  Constitutional: She is oriented to person, place, and time. She appears well-developed and well-nourished. No distress.  HENT:  Head: Normocephalic and atraumatic.  Right Ear: External ear normal.  Left Ear: External ear normal.  Mouth/Throat: Oropharynx is clear and moist. No oropharyngeal exudate.  Eyes: Conjunctivae are normal. Pupils are equal, round, and reactive to light.  Neck: Normal range of motion. Neck supple. No thyromegaly present.  Cardiovascular: Normal rate, regular rhythm, normal heart sounds and intact distal pulses.  Exam reveals no gallop.   No murmur heard. Pulmonary/Chest: Effort normal and breath sounds  normal. No respiratory distress. She has no wheezes. She has no rales.  Abdominal: Soft. There is no tenderness.  Musculoskeletal: She exhibits no edema and no tenderness.  Lymphadenopathy:    She has no cervical adenopathy.    She has no axillary adenopathy.  Neurological: She is alert and oriented to person, place, and time.  Skin: Rash noted.  Psoriatic rash on hands and feet  Psychiatric: She has a normal mood and affect. Her behavior is normal.          Assessment & Plan:

## 2013-04-12 NOTE — Assessment & Plan Note (Signed)
Healthy Rededicating herself to fitness efforts---discussed UTD on preventative care

## 2013-04-12 NOTE — Addendum Note (Signed)
Addended by: Shon Millet on: 04/12/2013 09:20 AM   Modules accepted: Orders

## 2013-04-12 NOTE — Assessment & Plan Note (Signed)
No problems with the atorvastatin Will check labs

## 2013-04-12 NOTE — Assessment & Plan Note (Signed)
Sees the dermatologist

## 2013-04-12 NOTE — Assessment & Plan Note (Signed)
BP Readings from Last 3 Encounters:  04/12/13 136/90  04/11/12 128/78  10/07/11 130/90   Reasonable control

## 2013-05-30 ENCOUNTER — Other Ambulatory Visit: Payer: Self-pay | Admitting: Internal Medicine

## 2013-08-20 ENCOUNTER — Encounter: Payer: Self-pay | Admitting: Gynecology

## 2013-08-20 ENCOUNTER — Ambulatory Visit (INDEPENDENT_AMBULATORY_CARE_PROVIDER_SITE_OTHER): Payer: BC Managed Care – PPO | Admitting: Gynecology

## 2013-08-20 ENCOUNTER — Other Ambulatory Visit (HOSPITAL_COMMUNITY)
Admission: RE | Admit: 2013-08-20 | Discharge: 2013-08-20 | Disposition: A | Discharge status: Home / Self Care | Payer: BC Managed Care – PPO | Source: Ambulatory Visit | Attending: Gynecology | Admitting: Gynecology

## 2013-08-20 VITALS — BP 144/94 | Ht 66.5 in | Wt 228.0 lb

## 2013-08-20 DIAGNOSIS — N951 Menopausal and female climacteric states: Secondary | ICD-10-CM

## 2013-08-20 DIAGNOSIS — M899 Disorder of bone, unspecified: Secondary | ICD-10-CM

## 2013-08-20 DIAGNOSIS — Z780 Asymptomatic menopausal state: Secondary | ICD-10-CM

## 2013-08-20 DIAGNOSIS — Z1151 Encounter for screening for human papillomavirus (HPV): Secondary | ICD-10-CM | POA: Insufficient documentation

## 2013-08-20 DIAGNOSIS — M8580 Other specified disorders of bone density and structure, unspecified site: Secondary | ICD-10-CM

## 2013-08-20 DIAGNOSIS — M949 Disorder of cartilage, unspecified: Secondary | ICD-10-CM

## 2013-08-20 DIAGNOSIS — Z01419 Encounter for gynecological examination (general) (routine) without abnormal findings: Secondary | ICD-10-CM

## 2013-08-20 NOTE — Patient Instructions (Signed)
Bone Densitometry Bone densitometry is a special X-ray that measures your bone density and can be used to help predict your risk of bone fractures. This test is used to determine bone mineral content and density to diagnose osteoporosis. Osteoporosis is the loss of bone that may cause the bone to become weak. Osteoporosis commonly occurs in women entering menopause. However, it may be found in men and in people with other diseases. PREPARATION FOR TEST No preparation necessary. WHO SHOULD BE TESTED?  All women older than 65.  Postmenopausal women (50 to 65) with risk factors for osteoporosis.  People with a previous fracture caused by normal activities.  People with a small body frame (less than 127 poundsor a body mass index [BMI] of less than 21).  People who have a parent with a hip fracture or history of osteoporosis.  People who smoke.  People who have rheumatoid arthritis.  Anyone who engages in excessive alcohol use (more than 3 drinks most days).  Women who experience early menopause. WHEN SHOULD YOU BE RETESTED? Current guidelines suggest that you should wait at least 2 years before doing a bone density test again if your first test was normal.Recent studies indicated that women with normal bone density may be able to wait a few years before needing to repeat a bone density test. You should discuss this with your caregiver.  NORMAL FINDINGS   Normal: less than standard deviation below normal (greater than -1).  Osteopenia: 1 to 2.5 standard deviations below normal (-1 to -2.5).  Osteoporosis: greater than 2.5 standard deviations below normal (less than -2.5). Test results are reported as a "T score" and a "Z score."The T score is a number that compares your bone density with the bone density of healthy, young women.The Z score is a number that compares your bone density with the scores of women who are the same age, gender, and race.  Ranges for normal findings may vary  among different laboratories and hospitals. You should always check with your doctor after having lab work or other tests done to discuss the meaning of your test results and whether your values are considered within normal limits. MEANING OF TEST  Your caregiver will go over the test results with you and discuss the importance and meaning of your results, as well as treatment options and the need for additional tests if necessary. OBTAINING THE TEST RESULTS It is your responsibility to obtain your test results. Ask the lab or department performing the test when and how you will get your results. Document Released: 06/21/2004 Document Revised: 08/22/2011 Document Reviewed: 07/14/2010 [employer] Patient Information 2014 [employer].  

## 2013-08-20 NOTE — Progress Notes (Signed)
Tanya Gomez 11-Mar-1956 [id number]   History:    58 y.o.  for annual gyn exam was not been seen in the office is 2012. Patient has been menopausal for several years and has not been on hormone replacement therapy and is asymptomatic today. Her primary physician is Dr. Silvio Pate who has been treating her for her hyperlipidemia. Patient with known history of colon polyps. Her last colonoscopy was in 2008 and she is overdue for her colonoscopy. Her last bone density study was in 2012 her lowest T score was -1.1 at the right femoral neck with normal FRAX analysis. Patient with prior tubal ligation. Patient with no prior history of abnormal Pap smears.  Past medical history,surgical history, family history and social history were all reviewed and documented in the EPIC chart.  Gynecologic History No LMP recorded. Patient is postmenopausal. Contraception: post menopausal status Last Pap: 2012. Results were: normal Last mammogram: 2012. Results were: normal  Obstetric History OB History  Gravida Para Term Preterm AB SAB TAB Ectopic Multiple Living  2 2        2     # Outcome Date GA Lbr Len/2nd Weight Sex Delivery Anes PTL Lv  2 PAR           1 PAR                ROS: A ROS was performed and pertinent positives and negatives are included in the history.  GENERAL: No fevers or chills. HEENT: No change in vision, no earache, sore throat or sinus congestion. NECK: No pain or stiffness. CARDIOVASCULAR: No chest pain or pressure. No palpitations. PULMONARY: No shortness of breath, cough or wheeze. GASTROINTESTINAL: No abdominal pain, nausea, vomiting or diarrhea, melena or bright red blood per rectum. GENITOURINARY: No urinary frequency, urgency, hesitancy or dysuria. MUSCULOSKELETAL: No joint or muscle pain, no back pain, no recent trauma. DERMATOLOGIC: No rash, no itching, no lesions. ENDOCRINE: No polyuria, polydipsia, no heat or cold intolerance. No recent change in weight. HEMATOLOGICAL: No  anemia or easy bruising or bleeding. NEUROLOGIC: No headache, seizures, numbness, tingling or weakness. PSYCHIATRIC: No depression, no loss of interest in normal activity or change in sleep pattern.     Exam: chaperone present  BP 144/94  Ht 5' 6.5" (1.689 m)  Wt 228 lb (103.42 kg)  BMI 36.25 kg/m2  Body mass index is 36.25 kg/(m^2).  General appearance : Well developed well nourished female. No acute distress HEENT: Neck supple, trachea midline, no carotid bruits, no thyroidmegaly Lungs: Clear to auscultation, no rhonchi or wheezes, or rib retractions  Heart: Regular rate and rhythm, no murmurs or gallops Breast:Examined in sitting and supine position were symmetrical in appearance, no palpable masses or tenderness,  no skin retraction, no nipple inversion, no nipple discharge, no skin discoloration, no axillary or supraclavicular lymphadenopathy Abdomen: no palpable masses or tenderness, no rebound or guarding Extremities: no edema or skin discoloration or tenderness  Pelvic:  Bartholin, Urethra, Skene Glands: Within normal limits             Vagina: No gross lesions or discharge  Cervix: No gross lesions or discharge  Uterus  anteverted, normal size, shape and consistency, non-tender and mobile  Adnexa  Without masses or tenderness  Anus and perineum  normal   Rectovaginal  normal sphincter tone without palpated masses or tenderness             Hemoccult PCP provides     Assessment/Plan:  58 y.o. female  for annual exam who has not been seen in the office in 2012. Patient menopausal but no vision motor symptoms. Pap smear was done today in accordance to the new guidelines. PCP has been drawn her blood work. We will schedule her for a bone density study here in our office in the next 2 weeks. She was reminded to schedule her mammogram since it is overdue. Discussed importance of calcium and vitamin D in regular exercise for osteoporosis prevention. We also discussed importance of  monthly breast exam.  Note: This dictation was prepared with  Dragon/digital dictation along withSmart phrase technology. Any transcriptional errors that result from this process are unintentional.   Terrance Mass MD, 11:42 AM 08/20/2013

## 2013-09-02 ENCOUNTER — Encounter: Payer: Self-pay | Admitting: Gynecology

## 2013-09-12 ENCOUNTER — Ambulatory Visit (INDEPENDENT_AMBULATORY_CARE_PROVIDER_SITE_OTHER): Payer: BC Managed Care – PPO

## 2013-09-12 DIAGNOSIS — M8580 Other specified disorders of bone density and structure, unspecified site: Secondary | ICD-10-CM

## 2013-09-12 DIAGNOSIS — N951 Menopausal and female climacteric states: Secondary | ICD-10-CM

## 2013-09-12 DIAGNOSIS — Z780 Asymptomatic menopausal state: Secondary | ICD-10-CM

## 2013-09-12 DIAGNOSIS — M899 Disorder of bone, unspecified: Secondary | ICD-10-CM

## 2013-09-12 DIAGNOSIS — M949 Disorder of cartilage, unspecified: Secondary | ICD-10-CM

## 2013-10-11 ENCOUNTER — Ambulatory Visit (INDEPENDENT_AMBULATORY_CARE_PROVIDER_SITE_OTHER): Payer: BC Managed Care – PPO | Admitting: Internal Medicine

## 2013-10-11 ENCOUNTER — Encounter: Payer: Self-pay | Admitting: Internal Medicine

## 2013-10-11 VITALS — BP 130/78 | HR 104 | Temp 98.5°F | Wt 225.0 lb

## 2013-10-11 DIAGNOSIS — J019 Acute sinusitis, unspecified: Secondary | ICD-10-CM | POA: Insufficient documentation

## 2013-10-11 MED ORDER — AMOXICILLIN 500 MG PO TABS
1000.0000 mg | ORAL_TABLET | Freq: Two times a day (BID) | ORAL | Status: DC
Start: 2013-10-11 — End: 2014-04-15

## 2013-10-11 NOTE — Progress Notes (Signed)
Pre visit review using our clinic review tool, if applicable. No additional management support is needed unless otherwise documented below in the visit note. 

## 2013-10-11 NOTE — Assessment & Plan Note (Signed)
Seems to have secondary bacterial infection Discussed symptom relief Amoxil Rx

## 2013-10-11 NOTE — Progress Notes (Signed)
   Subjective:    Patient ID: Tanya Gomez, female    DOB: [birth year], 58 y.o.   MRN: [id number]  HPI Having sore throat for a week Now hurts with swallowing Headache Ears are burning  Some sneezing and cough Some head congestion--lots of yellow mucus with some blood Lots of PND with cough  No fever No SOB No night sweats or chills  Using advil cold and sinus--- and alka seltzer cold. Some help  Current Outpatient Prescriptions on File Prior to Visit  Medication Sig Dispense Refill  . atorvastatin (LIPITOR) 20 MG tablet TAKE 1 TABLET BY MOUTH DAILY.  90 tablet  3  . hydrochlorothiazide (MICROZIDE) 12.5 MG capsule TAKE 1 CAPSULE BY MOUTH EVERY MORNING  90 capsule  2  . Multiple Vitamin (MULTIVITAMIN) capsule Take 1 capsule by mouth daily.       No current facility-administered medications on file prior to visit.    No Known Allergies  Past Medical History  Diagnosis Date  . Hx of colonic polyps     adenomatous  . Hyperlipidemia   . Osteoarthritis   . Hypertension     Past Surgical History  Procedure Laterality Date  . Vaginal delivery      x2  . Tubal ligation    . Patellar tendon repair  1999    left, spurs removed    Family History  Problem Relation Age of Onset  . Diabetes Mother   . Stroke Mother   . Peripheral vascular disease Mother   . Coronary artery disease Father   . Diabetes Father   . Heart disease Father   . Brain cancer Maternal Grandmother   . Hypertension Neg Hx   . Hearing loss Neg Hx     History   Social History  . Marital Status: Married    Spouse Name: N/A    Number of Children: 2  . 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 Smoking status: Never Smoker   . Smokeless tobacco: Never Used  . Alcohol Use: Yes     Comment: occasional  . Drug Use: No  . Sexual Activity: Yes   Other Topics Concern  . Not on file   Social History Narrative    . No narrative on file   Review of Systems No rash No vomiting or diarrhea Appetite is okay     Objective:   Physical Exam  Constitutional: She appears well-developed and well-nourished. No distress.  HENT:  Mild maxillary tenderness Moderate nasal inflammation TMs normal Mild pharyngeal injection without exudates  Neck: Normal range of motion. Neck supple.  Small non tender ant cervical nodes  Pulmonary/Chest: Effort normal and breath sounds normal. No respiratory distress. She has no wheezes. She has no rales.  Skin: No rash noted.          Assessment & Plan:

## 2014-02-19 ENCOUNTER — Other Ambulatory Visit: Payer: Self-pay | Admitting: Internal Medicine

## 2014-03-24 ENCOUNTER — Other Ambulatory Visit: Payer: Self-pay | Admitting: Internal Medicine

## 2014-04-14 ENCOUNTER — Encounter: Payer: Self-pay | Admitting: Internal Medicine

## 2014-04-15 ENCOUNTER — Ambulatory Visit (INDEPENDENT_AMBULATORY_CARE_PROVIDER_SITE_OTHER): Payer: BC Managed Care – PPO | Admitting: Internal Medicine

## 2014-04-15 ENCOUNTER — Encounter: Payer: Self-pay | Admitting: Internal Medicine

## 2014-04-15 VITALS — BP 128/80 | HR 88 | Temp 97.9°F | Ht 66.5 in | Wt 223.0 lb

## 2014-04-15 DIAGNOSIS — I10 Essential (primary) hypertension: Secondary | ICD-10-CM

## 2014-04-15 DIAGNOSIS — Z86010 Personal history of colonic polyps: Secondary | ICD-10-CM

## 2014-04-15 DIAGNOSIS — E785 Hyperlipidemia, unspecified: Secondary | ICD-10-CM

## 2014-04-15 DIAGNOSIS — Z23 Encounter for immunization: Secondary | ICD-10-CM

## 2014-04-15 DIAGNOSIS — Z0000 Encounter for general adult medical examination without abnormal findings: Secondary | ICD-10-CM

## 2014-04-15 LAB — CBC WITH DIFFERENTIAL/PLATELET
BASOS ABS: 0 10*3/uL (ref 0.0–0.1)
BASOS PCT: 0.4 % (ref 0.0–3.0)
EOS ABS: 0.2 10*3/uL (ref 0.0–0.7)
Eosinophils Relative: 2.6 % (ref 0.0–5.0)
HCT: 43.1 % (ref 36.0–46.0)
Hemoglobin: 14.3 g/dL (ref 12.0–15.0)
LYMPHS PCT: 31 % (ref 12.0–46.0)
Lymphs Abs: 2.5 10*3/uL (ref 0.7–4.0)
MCHC: 33.2 g/dL (ref 30.0–36.0)
MCV: 92.3 fl (ref 78.0–100.0)
Monocytes Absolute: 0.5 10*3/uL (ref 0.1–1.0)
Monocytes Relative: 6.5 % (ref 3.0–12.0)
NEUTROS PCT: 59.5 % (ref 43.0–77.0)
Neutro Abs: 4.9 10*3/uL (ref 1.4–7.7)
PLATELETS: 283 10*3/uL (ref 150.0–400.0)
RBC: 4.67 Mil/uL (ref 3.87–5.11)
RDW: 12.3 % (ref 11.5–15.5)
WBC: 8.2 10*3/uL (ref 4.0–10.5)

## 2014-04-15 LAB — LIPID PANEL
CHOLESTEROL: 165 mg/dL (ref 0–200)
HDL: 55.1 mg/dL (ref >39.00)
LDL Cholesterol: 91 mg/dL (ref 0–99)
NonHDL: 109.9
Total CHOL/HDL Ratio: 3
Triglycerides: 97 mg/dL (ref 0.0–149.0)
VLDL: 19.4 mg/dL (ref 0.0–40.0)

## 2014-04-15 LAB — COMPREHENSIVE METABOLIC PANEL
ALBUMIN: 3.7 g/dL (ref 3.5–5.2)
ALK PHOS: 69 U/L (ref 39–117)
ALT: 32 U/L (ref 0–35)
AST: 27 U/L (ref 0–37)
BILIRUBIN TOTAL: 0.8 mg/dL (ref 0.2–1.2)
BUN: 15 mg/dL (ref 6–23)
CO2: 30 meq/L (ref 19–32)
Calcium: 9.5 mg/dL (ref 8.4–10.5)
Chloride: 103 mEq/L (ref 96–112)
Creatinine, Ser: 0.9 mg/dL (ref 0.4–1.2)
GFR: 69.11 mL/min (ref >60.00)
GLUCOSE: 92 mg/dL (ref 70–99)
POTASSIUM: 4.2 meq/L (ref 3.5–5.1)
Sodium: 139 mEq/L (ref 135–145)
TOTAL PROTEIN: 7.5 g/dL (ref 6.0–8.3)

## 2014-04-15 LAB — T4, FREE: Free T4: 1.12 ng/dL (ref 0.60–1.60)

## 2014-04-15 NOTE — Progress Notes (Signed)
Subjective:    Patient ID: Tanya Gomez, female    DOB: 11-20-1955, 58 y.o.   MRN: [id number]  HPI Here for physical  Has had some indigestion nightly a few months ago Had some RUQ pain briefly at night Discussed gallbladder No regular heartburn and no swallowing problems  Did gain 5# this year No regular exercise  Takes meds regularly No problems  Current Outpatient Prescriptions on File Prior to Visit  Medication Sig Dispense Refill  . atorvastatin (LIPITOR) 20 MG tablet TAKE 1 TABLET BY MOUTH DAILY. 90 tablet 0  . hydrochlorothiazide (MICROZIDE) 12.5 MG capsule TAKE 1 CAPSULE BY MOUTH EVERY MORNING 90 capsule 0  . Multiple Vitamin (MULTIVITAMIN) capsule Take 1 capsule by mouth daily.     No current facility-administered medications on file prior to visit.    No Known Allergies  Past Medical History  Diagnosis Date  . Hx of colonic polyps     adenomatous  . Hyperlipidemia   . Osteoarthritis   . Hypertension     Past Surgical History  Procedure Laterality Date  . Vaginal delivery      x2  . Tubal ligation    . Patellar tendon repair  1999    left, spurs removed    Family History  Problem Relation Age of Onset  . Diabetes Mother   . Stroke Mother   . Peripheral vascular disease Mother   . Coronary artery disease Father   . Diabetes Father   . Heart disease Father   . Brain cancer Maternal Grandmother   . Hypertension Neg Hx   . Hearing loss Neg Hx     History   Social History  . Marital Status: Married    Spouse Name: N/A    Number of Children: 2  . 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 Smoking status: Never Smoker   . Smokeless tobacco: Never Used  . Alcohol Use: Yes     Comment: occasional  . Drug Use: No  . Sexual Activity: Yes   Other Topics Concern  . Not on file   Social History Narrative   Review of Systems  Constitutional: Positive for  unexpected weight change. Negative for fatigue.       Wears seat belt  HENT: Positive for hearing loss and tinnitus. Negative for dental problem.        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       No diplopia or unilateral vision loss  Respiratory: Negative for cough, chest tightness and shortness of breath.   Cardiovascular: Negative for chest pain, palpitations and leg swelling.  Gastrointestinal: Negative for nausea, vomiting, constipation and blood in stool.  Endocrine: Negative for polydipsia and polyuria.  Genitourinary: Negative for hematuria, difficulty urinating and dyspareunia.       Keeps up with Dr Corky Mull results of Pap and DEXA  Musculoskeletal: Negative for back pain, joint swelling and arthralgias.  Skin: Positive for rash.       Still with plaque psoriasis--- did have some left foot swelling she thinks was related to this. Has cream for this  Allergic/Immunologic: Negative for environmental allergies and immunocompromised state.  Neurological: Negative for dizziness, syncope, weakness, light-headedness, numbness and headaches.  Psychiatric/Behavioral: Negative for sleep disturbance and dysphoric mood. The patient is not nervous/anxious.        Objective:   Physical Exam  Constitutional: She is oriented to person, place, and time. She appears well-developed and well-nourished. No distress.  HENT:  Head: Normocephalic and atraumatic.  Right Ear: External ear normal.  Left Ear: External ear normal.  Mouth/Throat: Oropharynx is clear and moist. No oropharyngeal exudate.  Eyes: Conjunctivae and EOM are normal. Pupils are equal, round, and reactive to light.  Neck: Normal range of motion. Neck supple. No thyromegaly present.  Cardiovascular: Normal rate, regular rhythm, normal heart sounds and intact distal pulses.  Exam reveals no gallop.   No murmur heard. Pulmonary/Chest: Effort normal and breath sounds normal. No  respiratory distress. She has no wheezes. She has no rales.  Abdominal: Soft. There is no tenderness.  Musculoskeletal: She exhibits no edema or tenderness.  Lymphadenopathy:    She has no cervical adenopathy.  Neurological: She is alert and oriented to person, place, and time.  Skin: Skin is warm.  Psychiatric: She has a normal mood and affect. Her behavior is normal.          Assessment & Plan:

## 2014-04-15 NOTE — Patient Instructions (Addendum)
Please set up your screening mammogram.  DASH Eating Plan DASH stands for "Dietary Approaches to Stop Hypertension." The DASH eating plan is a healthy eating plan that has been shown to reduce high blood pressure (hypertension). Additional health benefits may include reducing the risk of type 2 diabetes mellitus, heart disease, and stroke. The DASH eating plan may also help with weight loss. WHAT DO I NEED TO KNOW ABOUT THE DASH EATING PLAN? For the DASH eating plan, you will follow these general guidelines:  Choose foods with a percent daily value for sodium of less than 5% (as listed on the food label).  Use salt-free seasonings or herbs instead of table salt or sea salt.  Check with your health care provider or pharmacist before using salt substitutes.  Eat lower-sodium products, often labeled as "lower sodium" or "no salt added."  Eat fresh foods.  Eat more vegetables, fruits, and low-fat dairy products.  Choose whole grains. Look for the word "whole" as the first word in the ingredient list.  Choose fish and skinless chicken or turkey more often than red meat. Limit fish, poultry, and meat to 6 oz (170 g) each day.  Limit sweets, desserts, sugars, and sugary drinks.  Choose heart-healthy fats.  Limit cheese to 1 oz (28 g) per day.  Eat more home-cooked food and less restaurant, buffet, and fast food.  Limit fried foods.  Cook foods using methods other than frying.  Limit canned vegetables. If you do use them, rinse them well to decrease the sodium.  When eating at a restaurant, ask that your food be prepared with less salt, or no salt if possible. WHAT FOODS CAN I EAT? Seek help from a dietitian for individual calorie needs. Grains Whole grain or whole wheat bread. Brown rice. Whole grain or whole wheat pasta. Quinoa, bulgur, and whole grain cereals. Low-sodium cereals. Corn or whole wheat flour tortillas. Whole grain cornbread. Whole grain crackers. Low-sodium  crackers. Vegetables Fresh or frozen vegetables (raw, steamed, roasted, or grilled). Low-sodium or reduced-sodium tomato and vegetable juices. Low-sodium or reduced-sodium tomato sauce and paste. Low-sodium or reduced-sodium canned vegetables.  Fruits All fresh, canned (in natural juice), or frozen fruits. Meat and Other Protein Products Ground beef (85% or leaner), grass-fed beef, or beef trimmed of fat. Skinless chicken or turkey. Ground chicken or turkey. Pork trimmed of fat. All fish and seafood. Eggs. Dried beans, peas, or lentils. Unsalted nuts and seeds. Unsalted canned beans. Dairy Low-fat dairy products, such as skim or 1% milk, 2% or reduced-fat cheeses, low-fat ricotta or cottage cheese, or plain low-fat yogurt. Low-sodium or reduced-sodium cheeses. Fats and Oils Tub margarines without trans fats. Light or reduced-fat mayonnaise and salad dressings (reduced sodium). Avocado. Safflower, olive, or canola oils. Natural peanut or almond butter. Other Unsalted popcorn and pretzels. The items listed above may not be a complete list of recommended foods or beverages. Contact your dietitian for more options. WHAT FOODS ARE NOT RECOMMENDED? Grains White bread. White pasta. White rice. Refined cornbread. Bagels and croissants. Crackers that contain trans fat. Vegetables Creamed or fried vegetables. Vegetables in a cheese sauce. Regular canned vegetables. Regular canned tomato sauce and paste. Regular tomato and vegetable juices. Fruits Dried fruits. Canned fruit in light or heavy syrup. Fruit juice. Meat and Other Protein Products Fatty cuts of meat. Ribs, chicken wings, bacon, sausage, bologna, salami, chitterlings, fatback, hot dogs, bratwurst, and packaged luncheon meats. Salted nuts and seeds. Canned beans with salt. Dairy Whole or 2% milk, cream, half-and-half, and   cream cheese. Whole-fat or sweetened yogurt. Full-fat cheeses or blue cheese. Nondairy creamers and whipped toppings.  Processed cheese, cheese spreads, or cheese curds. Condiments Onion and garlic salt, seasoned salt, table salt, and sea salt. Canned and packaged gravies. Worcestershire sauce. Tartar sauce. Barbecue sauce. Teriyaki sauce. Soy sauce, including reduced sodium. Steak sauce. Fish sauce. Oyster sauce. Cocktail sauce. Horseradish. Ketchup and mustard. Meat flavorings and tenderizers. Bouillon cubes. Hot sauce. Tabasco sauce. Marinades. Taco seasonings. Relishes. Fats and Oils Butter, stick margarine, lard, shortening, ghee, and bacon fat. Coconut, palm kernel, or palm oils. Regular salad dressings. Other Pickles and olives. Salted popcorn and pretzels. The items listed above may not be a complete list of foods and beverages to avoid. Contact your dietitian for more information. WHERE CAN I FIND MORE INFORMATION? National Heart, Lung, and Blood Institute: www.nhlbi.nih.gov/health/health-topics/topics/dash/ Document Released: 05/19/2011 Document Revised: 10/14/2013 Document Reviewed: 04/03/2013 ExitCare Patient Information 2015 [employer]. This information is not intended to replace advice given to you by your health care provider. Make sure you discuss any questions you have with your health care provider.  

## 2014-04-15 NOTE — Assessment & Plan Note (Signed)
No problems with statin Due for labs 

## 2014-04-15 NOTE — Progress Notes (Signed)
Pre visit review using our clinic review tool, if applicable. No additional management support is needed unless otherwise documented below in the visit note. 

## 2014-04-15 NOTE — Assessment & Plan Note (Addendum)
Healthy but needs to work on fitness Info given Flu shot today UTD on pap and mammogram Overdue for colonoscopy due to adenomatous polyp in 2008

## 2014-04-15 NOTE — Assessment & Plan Note (Signed)
BP Readings from Last 3 Encounters:  04/15/14 128/80  10/11/13 130/78  08/20/13 144/94   Good control No changes needed

## 2014-04-16 ENCOUNTER — Telehealth: Payer: Self-pay | Admitting: Internal Medicine

## 2014-04-16 ENCOUNTER — Encounter: Payer: Self-pay | Admitting: Gastroenterology

## 2014-04-16 NOTE — Addendum Note (Signed)
Addended by: Despina Hidden on: 04/16/2014 03:59 PM   Modules accepted: Orders

## 2014-04-16 NOTE — Telephone Encounter (Signed)
emmi emailed °

## 2014-05-28 ENCOUNTER — Other Ambulatory Visit: Payer: Self-pay | Admitting: Internal Medicine

## 2014-06-17 ENCOUNTER — Ambulatory Visit (AMBULATORY_SURGERY_CENTER): Payer: Self-pay | Admitting: *Deleted

## 2014-06-17 VITALS — Ht 67.0 in | Wt 227.4 lb

## 2014-06-17 DIAGNOSIS — Z86010 Personal history of colonic polyps: Secondary | ICD-10-CM

## 2014-06-17 MED ORDER — MOVIPREP 100 G PO SOLR: ORAL | Status: DC

## 2014-06-17 NOTE — Progress Notes (Signed)
No egg or soy allergy  No anesthesia or intubation problems per pt  No diet medications taken  Registered in EMMI   

## 2014-06-23 ENCOUNTER — Other Ambulatory Visit: Payer: Self-pay | Admitting: Internal Medicine

## 2014-06-24 ENCOUNTER — Ambulatory Visit (AMBULATORY_SURGERY_CENTER): Payer: BLUE CROSS/BLUE SHIELD | Admitting: Gastroenterology

## 2014-06-24 ENCOUNTER — Encounter: Payer: Self-pay | Admitting: Gastroenterology

## 2014-06-24 VITALS — BP 141/76 | HR 81 | Temp 97.3°F | Resp 16 | Ht 67.0 in | Wt 227.0 lb

## 2014-06-24 DIAGNOSIS — Z86010 Personal history of colonic polyps: Secondary | ICD-10-CM

## 2014-06-24 MED ORDER — SODIUM CHLORIDE 0.9 % IV SOLN: 500.0000 mL | INTRAVENOUS | Status: DC

## 2014-06-24 NOTE — Progress Notes (Signed)
Procedure ends, to recovery, report given and VSS. 

## 2014-06-24 NOTE — Patient Instructions (Signed)

## 2014-06-24 NOTE — Op Note (Signed)
[place]  [address] [place], [id number]   COLONOSCOPY PROCEDURE REPORT  PATIENT: Tanya Gomez, Tanya Gomez  MR#: [id number] BIRTHDATE: 1955/09/06 , 58  yrs. old GENDER: female ENDOSCOPIST: Ladene Artist, MD, [facility] PROCEDURE DATE:  06/24/2014 PROCEDURE:   Colonoscopy, surveillance First Screening Colonoscopy - Avg.  risk and is 50 yrs.  old or older - No.  Prior Negative Screening - Now for repeat screening. N/A  History of Adenoma - Now for follow-up colonoscopy & has been > or = to 3 yrs.  N/A  Polyps Removed Today? No.  Polyps Removed Today? No.  Recommend repeat exam, <10 yrs? Polyps Removed Today? No.  Recommend repeat exam, <10 yrs? Yes.  Polyps Removed Today? No.  Recommend repeat exam, <10 yrs? Polyps Removed Today? No. Recommend repeat exam, <10 yrs? Yes.  High risk (family or personal hx). ASA CLASS:   Class II INDICATIONS:surveillance colonoscopy based on a history of adenomatous colonic polyp(s). MEDICATIONS: Monitored anesthesia care and Propofol 250 mg IV DESCRIPTION OF PROCEDURE:   After the risks benefits and alternatives of the procedure were thoroughly explained, informed consent was obtained.  The digital rectal exam revealed no abnormalities of the rectum.   The LB [id number]  endoscope was introduced through the anus and advanced to the cecum, which was identified by both the appendix and ileocecal valve. No adverse events experienced.   The quality of the prep was excellent, using MoviPrep  The instrument was then slowly withdrawn as the colon was fully examined.    COLON FINDINGS: A normal appearing cecum, ileocecal valve, and appendiceal orifice were identified.  The ascending, transverse, descending, sigmoid colon, and rectum appeared unremarkable. Retroflexed views revealed no abnormalities. The time to cecum=2 minutes 49 seconds.  Withdrawal time=9 minutes 01 seconds.  The scope was withdrawn and the procedure  completed.  COMPLICATIONS: There were no immediate complications.  ENDOSCOPIC IMPRESSION: 1.  Normal colonoscopy  RECOMMENDATIONS: 1.  Repeat Colonoscopy in 5 years.  eSigned:  Ladene Artist, MD, [facility] 06/24/2014 11:01 AM

## 2014-06-25 ENCOUNTER — Telehealth: Payer: Self-pay | Admitting: *Deleted

## 2014-06-25 NOTE — Telephone Encounter (Signed)
  Follow up Call-  Call back number 06/24/2014  Post procedure Call Back phone  # [phone]  Permission to leave phone message Yes     Patient questions:  Do you have a fever, pain , or abdominal swelling? No. Pain Score  0 *  Have you tolerated food without any problems? Yes.    Have you been able to return to your normal activities? Yes.    Do you have any questions about your discharge instructions: Diet   No. Medications  No. Follow up visit  No.  Do you have questions or concerns about your Care? No.  Actions: * If pain score is 4 or above: No action needed, pain <4.

## 2014-11-17 ENCOUNTER — Telehealth: Payer: Self-pay | Admitting: *Deleted

## 2014-11-17 NOTE — Telephone Encounter (Signed)
Mammogram f/u call: pt hasn't had mammogram in over 2 years. Called pt and she will call them and schedule appt now. Phone # to [place] given to pt

## 2014-12-30 ENCOUNTER — Encounter: Payer: Self-pay | Admitting: Gynecology

## 2014-12-30 LAB — HM MAMMOGRAPHY

## 2015-01-01 ENCOUNTER — Encounter: Payer: Self-pay | Admitting: Gynecology

## 2015-01-01 ENCOUNTER — Ambulatory Visit (INDEPENDENT_AMBULATORY_CARE_PROVIDER_SITE_OTHER): Payer: BLUE CROSS/BLUE SHIELD | Admitting: Gynecology

## 2015-01-01 VITALS — BP 136/90 | Ht 66.25 in | Wt 220.0 lb

## 2015-01-01 DIAGNOSIS — M8580 Other specified disorders of bone density and structure, unspecified site: Secondary | ICD-10-CM | POA: Diagnosis not present

## 2015-01-01 DIAGNOSIS — Z780 Asymptomatic menopausal state: Secondary | ICD-10-CM | POA: Diagnosis not present

## 2015-01-01 DIAGNOSIS — Z113 Encounter for screening for infections with a predominantly sexual mode of transmission: Secondary | ICD-10-CM | POA: Diagnosis not present

## 2015-01-01 DIAGNOSIS — Z01419 Encounter for gynecological examination (general) (routine) without abnormal findings: Secondary | ICD-10-CM

## 2015-01-01 NOTE — Progress Notes (Signed)
Tanya Gomez 04/30/1956 [id number]   History:    59 y.o.  for annual gyn exam who is separated from her husband and wanted to have an STD screen since she has found out that her husband has been unfaithful. She denies any vaginal discharge. Patient with no past history of any abnormal Pap smears in the past. Patient is menopausal on no hormone replacement therapy and is asymptomatic today.Her primary physician is Dr. Silvio Pate who has been treating her for her hyperlipidemia. Patient with known history of colon polyps had a benign colonoscopy this year. Patient's last bone density study was in 2015 the lowest T score was -1.1 at the right femoral neck and with a normal Frax analysis and stable bone mineralization when compare with previous study of 2012.  Past medical history,surgical history, family history and social history were all reviewed and documented in the EPIC chart.  Gynecologic History No LMP recorded. Patient is postmenopausal. Contraception: post menopausal status Last Pap: 2015. Results were: normal Last mammogram: 2016. Results were: normal  Obstetric History OB History  Gravida Para Term Preterm AB SAB TAB Ectopic Multiple Living  2 2        2     # Outcome Date GA Lbr Len/2nd Weight Sex Delivery Anes PTL Lv  2 Para      Vag-Spont     1 Para      Vag-Spont          ROS: A ROS was performed and pertinent positives and negatives are included in the history.  GENERAL: No fevers or chills. HEENT: No change in vision, no earache, sore throat or sinus congestion. NECK: No pain or stiffness. CARDIOVASCULAR: No chest pain or pressure. No palpitations. PULMONARY: No shortness of breath, cough or wheeze. GASTROINTESTINAL: No abdominal pain, nausea, vomiting or diarrhea, melena or bright red blood per rectum. GENITOURINARY: No urinary frequency, urgency, hesitancy or dysuria. MUSCULOSKELETAL: No joint or muscle pain, no back pain, no recent trauma. DERMATOLOGIC: No rash, no  itching, no lesions. ENDOCRINE: No polyuria, polydipsia, no heat or cold intolerance. No recent change in weight. HEMATOLOGICAL: No anemia or easy bruising or bleeding. NEUROLOGIC: No headache, seizures, numbness, tingling or weakness. PSYCHIATRIC: No depression, no loss of interest in normal activity or change in sleep pattern.     Exam: chaperone present  BP 136/90 mmHg  Ht 5' 6.25" (1.683 m)  Wt 220 lb (99.791 kg)  BMI 35.23 kg/m2  Body mass index is 35.23 kg/(m^2).  General appearance : Well developed well nourished female. No acute distress HEENT: Eyes: no retinal hemorrhage or exudates,  Neck supple, trachea midline, no carotid bruits, no thyroidmegaly Lungs: Clear to auscultation, no rhonchi or wheezes, or rib retractions  Heart: Regular rate and rhythm, no murmurs or gallops Breast:Examined in sitting and supine position were symmetrical in appearance, no palpable masses or tenderness,  no skin retraction, no nipple inversion, no nipple discharge, no skin discoloration, no axillary or supraclavicular lymphadenopathy Abdomen: no palpable masses or tenderness, no rebound or guarding Extremities: no edema or skin discoloration or tenderness  Pelvic:  Bartholin, Urethra, Skene Glands: Within normal limits             Vagina: No gross lesions or discharge  Cervix: No gross lesions or discharge  Uterus  anteverted, normal size, shape and consistency, non-tender and mobile  Adnexa  Without masses or tenderness  Anus and perineum  normal   Rectovaginal  normal sphincter tone without palpated masses or  tenderness             Hemoccult colonoscopy 2016 normal     Assessment/Plan:  59 y.o. female for annual exam requesting an STD screen. GC and Chlamydia culture was obtained today along with the following blood tests: HIV, RPR, hepatitis B and C. Pap smear not indicated this year. Patient to schedule bone density study next year. Patient was reminded importance of monthly breast exam.  We discussed importance of calcium vitamin D and regular exercise for osteoporosis prevention. Patient's PCP will be doing her blood work.   Terrance Mass MD, 8:55 AM 01/01/2015

## 2015-01-01 NOTE — Patient Instructions (Signed)
Shingles Vaccine What You Need to Know WHAT IS SHINGLES?  Shingles is a painful skin rash, often with blisters. It is also called Herpes Zoster or just Zoster.  A shingles rash usually appears on one side of the face or body and lasts from 2 to 4 weeks. Its main symptom is pain, which can be quite severe. Other symptoms of shingles can include fever, headache, chills, and upset stomach. Very rarely, a shingles infection can lead to pneumonia, hearing problems, blindness, brain inflammation (encephalitis), or death.  For about 1 person in 5, severe pain can continue even after the rash clears up. This is called post-herpetic neuralgia.  Shingles is caused by the Varicella Zoster virus. This is the same virus that causes chickenpox. Only someone who has had a case of chickenpox or rarely, has gotten chickenpox vaccine, can get shingles. The virus stays in your body. It can reappear many years later to cause a case of shingles.  You cannot catch shingles from another person with shingles. However, a person who has never had chickenpox (or chickenpox vaccine) could get chickenpox from someone with shingles. This is not very common.  Shingles is far more common in people 50 and older than in younger people. It is also more common in people whose immune systems are weakened because of a disease such as cancer or drugs such as steroids or chemotherapy.  At least 1 million people get shingles per year in the [place]. SHINGLES VACCINE  A vaccine for shingles was licensed in 2006. In clinical trials, the vaccine reduced the risk of shingles by 50%. It can also reduce the pain in people who still get shingles after being vaccinated.  A single dose of shingles vaccine is recommended for adults 60 years of age and older. SOME PEOPLE SHOULD NOT GET SHINGLES VACCINE OR SHOULD WAIT A person should not get shingles vaccine if he or she:  Has ever had a life-threatening allergic reaction to gelatin, the  antibiotic neomycin, or any other component of shingles vaccine. Tell your caregiver if you have any severe allergies.  Has a weakened immune system because of current:  AIDS or another disease that affects the immune system.  Treatment with drugs that affect the immune system, such as prolonged use of high-dose steroids.  Cancer treatment, such as radiation or chemotherapy.  Cancer affecting the bone marrow or lymphatic system, such as leukemia or lymphoma.  Is pregnant, or might be pregnant. Women should not become pregnant until at least 4 weeks after getting shingles vaccine. Someone with a minor illness, such as a cold, may be vaccinated. Anyone with a moderate or severe acute illness should usually wait until he or she recovers before getting the vaccine. This includes anyone with a temperature of 101.3 F (38 C) or higher. WHAT ARE THE RISKS FROM SHINGLES VACCINE?  A vaccine, like any medicine, could possibly cause serious problems, such as severe allergic reactions. However, the risk of a vaccine causing serious harm, or death, is extremely small.  No serious problems have been identified with shingles vaccine. Mild Problems  Redness, soreness, swelling, or itching at the site of the injection (about 1 person in 3).  Headache (about 1 person in 70). Like all vaccines, shingles vaccine is being closely monitored for unusual or severe problems. WHAT IF THERE IS A MODERATE OR SEVERE REACTION? What should I look for? Any unusual condition, such as a severe allergic reaction or a high fever. If a severe allergic reaction   occurred, it would be within a few minutes to an hour after the shot. Signs of a serious allergic reaction can include difficulty breathing, weakness, hoarseness or wheezing, a fast heartbeat, hives, dizziness, paleness, or swelling of the throat. What should I do?  Call your caregiver, or get the person to a caregiver right away.  Tell the caregiver what  happened, the date and time it happened, and when the vaccination was given.  Ask the caregiver to report the reaction by filing a Vaccine Adverse Event Reporting System (VAERS) form. Or, you can file this report through the VAERS web site at www.vaers.hhs.gov or by calling 1-800-822-7967. VAERS does not provide medical advice. HOW CAN I LEARN MORE?  Ask your caregiver. He or she can give you the vaccine package insert or suggest other sources of information.  Contact the Centers for Disease Control and Prevention (CDC):  Call 1-800-232-4636 (1-800-CDC-INFO).  Visit the CDC website at www.cdc.gov/vaccines CDC Shingles Vaccine VIS (03/18/08) Document Released: 03/27/2006 Document Revised: 08/22/2011 Document Reviewed: 09/19/2012 ExitCare Patient Information 2015 [employer]. This information is not intended to replace advice given to you by your health care provider. Make sure you discuss any questions you have with your health care provider.  

## 2015-01-03 LAB — GC/CHLAMYDIA PROBE AMP
CT Probe RNA: NEGATIVE
GC Probe RNA: NEGATIVE

## 2015-01-06 ENCOUNTER — Encounter: Payer: Self-pay | Admitting: Cardiology

## 2015-03-09 ENCOUNTER — Ambulatory Visit (INDEPENDENT_AMBULATORY_CARE_PROVIDER_SITE_OTHER): Payer: BLUE CROSS/BLUE SHIELD | Admitting: Internal Medicine

## 2015-03-09 ENCOUNTER — Encounter: Payer: Self-pay | Admitting: Internal Medicine

## 2015-03-09 VITALS — BP 148/90 | HR 75 | Temp 98.6°F | Wt 218.0 lb

## 2015-03-09 DIAGNOSIS — J0100 Acute maxillary sinusitis, unspecified: Secondary | ICD-10-CM

## 2015-03-09 MED ORDER — AMOXICILLIN 500 MG PO TABS: 1000.0000 mg | ORAL_TABLET | Freq: Two times a day (BID) | ORAL | Status: DC

## 2015-03-09 NOTE — Progress Notes (Signed)
   Subjective:    Patient ID: Tanya Gomez, female    DOB: Mar 22, 1956, 59 y.o.   MRN: [id number]  HPI Here due to sinus symptoms  Having right ear pain and left maxillary pressure Goes around to left occiput Ear pain for a week or more Some sore throat  No fever Some post nasal drip Not coughing No SOB Some rhinorrhea  Tried advil cold and sinus without help And regular ibuprofen--helps the neck pain  Current Outpatient Prescriptions on File Prior to Visit  Medication Sig Dispense Refill  . atorvastatin (LIPITOR) 20 MG tablet TAKE 1 TABLET BY MOUTH DAILY. 90 tablet 3  . hydrochlorothiazide (MICROZIDE) 12.5 MG capsule TAKE 1 CAPSULE BY MOUTH EVERY MORNING 90 capsule 3  . Multiple Vitamin (MULTIVITAMIN) capsule Take 1 capsule by mouth daily.     No current facility-administered medications on file prior to visit.    No Known Allergies  Past Medical History  Diagnosis Date  . Hx of colonic polyps     adenomatous  . Hyperlipidemia   . Hypertension   . Osteoarthritis     no per pt 06-17-14    Past Surgical History  Procedure Laterality Date  . Vaginal delivery      x2  . Tubal ligation    . Patellar tendon repair  1999    left, spurs removed  . Colonoscopy      Family History  Problem Relation Age of Onset  . Diabetes Mother   . Stroke Mother   . Peripheral vascular disease Mother   . Coronary artery disease Father   . Diabetes Father   . Heart disease Father   . Brain cancer Maternal Grandmother   . Hypertension Neg Hx   . Hearing loss Neg Hx   . Colon cancer Neg Hx   . Esophageal cancer Neg Hx   . Stomach cancer Neg Hx   . Rectal cancer Neg Hx     Social History   Social History  . Marital Status: Legally Separated    Spouse Name: N/A  . Number of Children: 2  . 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 Smoking status: Never Smoker   . Smokeless tobacco:  Never Used  . Alcohol Use: 0.0 oz/week    0 Standard drinks or equivalent per week     Comment: occasional  . Drug Use: No  . Sexual Activity: Not Currently   Other Topics Concern  . Not on file   Social History Narrative   Review of Systems  No rash Stomach is fine--no vomiting or diarrhea Eating okay     Objective:   Physical Exam  Constitutional: She appears well-developed and well-nourished. No distress.  HENT:  Mouth/Throat: Oropharynx is clear and moist. No oropharyngeal exudate.  Left maxillary pressure with palpation but not really painful Moderate nasal inflammation Slight green nasal mucus on right TMs not infected--?slight retraction on left  Neck: Normal range of motion. Neck supple.  Slight non tender anterior cervical nodes  Pulmonary/Chest: Effort normal and breath sounds normal. No respiratory distress. She has no wheezes. She has no rales.  Skin: No rash noted.          Assessment & Plan:

## 2015-03-09 NOTE — Progress Notes (Signed)
Pre visit review using our clinic review tool, if applicable. No additional management support is needed unless otherwise documented below in the visit note. 

## 2015-03-09 NOTE — Patient Instructions (Signed)
Please try 2 sprays of nasacort in each nostril daily (you may want to use it twice a day on the left). If you worsen, start the amoxicillin antibiotic

## 2015-03-09 NOTE — Assessment & Plan Note (Signed)
Not clearly bacterial infection  Will try nasacort first If worsens-- try amoxil

## 2015-04-17 ENCOUNTER — Encounter: Payer: BC Managed Care – PPO | Admitting: Internal Medicine

## 2015-04-24 ENCOUNTER — Encounter: Payer: Self-pay | Admitting: Internal Medicine

## 2015-04-24 ENCOUNTER — Ambulatory Visit (INDEPENDENT_AMBULATORY_CARE_PROVIDER_SITE_OTHER): Payer: BLUE CROSS/BLUE SHIELD | Admitting: Internal Medicine

## 2015-04-24 VITALS — BP 140/80 | HR 87 | Temp 98.4°F | Ht 66.0 in | Wt 215.0 lb

## 2015-04-24 DIAGNOSIS — Z23 Encounter for immunization: Secondary | ICD-10-CM

## 2015-04-24 DIAGNOSIS — I10 Essential (primary) hypertension: Secondary | ICD-10-CM

## 2015-04-24 DIAGNOSIS — E785 Hyperlipidemia, unspecified: Secondary | ICD-10-CM

## 2015-04-24 DIAGNOSIS — R208 Other disturbances of skin sensation: Secondary | ICD-10-CM | POA: Diagnosis not present

## 2015-04-24 DIAGNOSIS — Z0000 Encounter for general adult medical examination without abnormal findings: Secondary | ICD-10-CM | POA: Diagnosis not present

## 2015-04-24 DIAGNOSIS — R200 Anesthesia of skin: Secondary | ICD-10-CM | POA: Insufficient documentation

## 2015-04-24 LAB — COMPREHENSIVE METABOLIC PANEL
ALBUMIN: 4.7 g/dL (ref 3.5–5.2)
ALT: 25 U/L (ref 0–35)
AST: 26 U/L (ref 0–37)
Alkaline Phosphatase: 79 U/L (ref 39–117)
BUN: 19 mg/dL (ref 6–23)
CALCIUM: 10.1 mg/dL (ref 8.4–10.5)
CHLORIDE: 102 meq/L (ref 96–112)
CO2: 30 mEq/L (ref 19–32)
CREATININE: 0.8 mg/dL (ref 0.40–1.20)
GFR: 77.89 mL/min (ref >60.00)
Glucose, Bld: 93 mg/dL (ref 70–99)
POTASSIUM: 4.4 meq/L (ref 3.5–5.1)
Sodium: 140 mEq/L (ref 135–145)
Total Bilirubin: 0.6 mg/dL (ref 0.2–1.2)
Total Protein: 7.9 g/dL (ref 6.0–8.3)

## 2015-04-24 LAB — CBC WITH DIFFERENTIAL/PLATELET
BASOS PCT: 0.4 % (ref 0.0–3.0)
Basophils Absolute: 0 10*3/uL (ref 0.0–0.1)
EOS ABS: 0.2 10*3/uL (ref 0.0–0.7)
EOS PCT: 1.7 % (ref 0.0–5.0)
HEMATOCRIT: 45.1 % (ref 36.0–46.0)
HEMOGLOBIN: 15 g/dL (ref 12.0–15.0)
LYMPHS PCT: 32.2 % (ref 12.0–46.0)
Lymphs Abs: 3.3 10*3/uL (ref 0.7–4.0)
MCHC: 33.2 g/dL (ref 30.0–36.0)
MCV: 93.4 fl (ref 78.0–100.0)
Monocytes Absolute: 0.7 10*3/uL (ref 0.1–1.0)
Monocytes Relative: 6.4 % (ref 3.0–12.0)
Neutro Abs: 6.1 10*3/uL (ref 1.4–7.7)
Neutrophils Relative %: 59.3 % (ref 43.0–77.0)
Platelets: 293 10*3/uL (ref 150.0–400.0)
RBC: 4.83 Mil/uL (ref 3.87–5.11)
RDW: 12.5 % (ref 11.5–15.5)
WBC: 10.3 10*3/uL (ref 4.0–10.5)

## 2015-04-24 LAB — LIPID PANEL
CHOLESTEROL: 180 mg/dL (ref 0–200)
HDL: 65 mg/dL (ref >39.00)
LDL CALC: 99 mg/dL (ref 0–99)
NonHDL: 115.48
TRIGLYCERIDES: 81 mg/dL (ref 0.0–149.0)
Total CHOL/HDL Ratio: 3
VLDL: 16.2 mg/dL (ref 0.0–40.0)

## 2015-04-24 LAB — VITAMIN B12: VITAMIN B 12: 447 pg/mL (ref 211–911)

## 2015-04-24 LAB — T4, FREE: Free T4: 1.04 ng/dL (ref 0.60–1.60)

## 2015-04-24 NOTE — Assessment & Plan Note (Signed)
BP Readings from Last 3 Encounters:  04/24/15 140/80  03/09/15 148/90  01/01/15 136/90   Acceptable control Working on fitness

## 2015-04-24 NOTE — Progress Notes (Signed)
Pre visit review using our clinic review tool, if applicable. No additional management support is needed unless otherwise documented below in the visit note. 
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
## 2015-04-24 NOTE — Assessment & Plan Note (Signed)
Mild in left arm Will just check B12

## 2015-04-24 NOTE — Assessment & Plan Note (Signed)
No problems with statin Due for labs 

## 2015-04-24 NOTE — Assessment & Plan Note (Signed)
Healthy Working on fitness Will check RPR, HIV due to husband's infidelity Colon due 2021 mammo and pap due 2018

## 2015-04-24 NOTE — Addendum Note (Signed)
Addended by: Despina Hidden on: 04/24/2015 12:31 PM   Modules accepted: Orders

## 2015-04-25 LAB — RPR

## 2015-04-25 LAB — HIV ANTIBODY (ROUTINE TESTING W REFLEX): HIV: NONREACTIVE

## 2015-05-15 ENCOUNTER — Other Ambulatory Visit: Payer: Self-pay | Admitting: Internal Medicine

## 2015-05-28 ENCOUNTER — Other Ambulatory Visit: Payer: Self-pay | Admitting: Internal Medicine

## 2015-07-02 ENCOUNTER — Encounter: Payer: Self-pay | Admitting: Primary Care

## 2015-07-02 ENCOUNTER — Ambulatory Visit (INDEPENDENT_AMBULATORY_CARE_PROVIDER_SITE_OTHER): Payer: BLUE CROSS/BLUE SHIELD | Admitting: Primary Care

## 2015-07-02 VITALS — BP 142/78 | HR 76 | Temp 97.8°F | Ht 66.0 in | Wt 218.8 lb

## 2015-07-02 DIAGNOSIS — H9202 Otalgia, left ear: Secondary | ICD-10-CM | POA: Diagnosis not present

## 2015-07-02 NOTE — Patient Instructions (Signed)
Nasal Congestion: Try using Flonase (fluticasone) nasal spray. Instill 2 sprays in each nostril once daily.   Ear Pain: Try taking a daily antihistamine such as Zyrtec at bedtime. Do this for at least 2 weeks. Continue ibuprofen. Take 600 mg three times daily as needed for pain.  Please call me in 1 week if no improvement or if symptoms become worse.  It was a pleasure meeting you!

## 2015-07-02 NOTE — Progress Notes (Signed)
Subjective:    Patient ID: Tanya Gomez, female    DOB: 01-11-1956, 60 y.o.   MRN: [id number]  HPI  Tanya Gomez is a 60 year old female who presents today with a chief complaint of ear pain. She also reports sinus pressure and nasal congestion. Her symptoms have been present for the past 2-3 weeks. Denies sore throat, cough, fevers, mucous from nasal cavity. Her pain is mostly located to the left side just under ear and to left lateral neck. She's been taking Mucinex DM, aleve, and "sinus max" with temporary improvement.  She's been around her grandchildren who have been ill with ear infections and the cold. Overall she's feeling slightly improved.   Review of Systems  Constitutional: Negative for fever and chills.  HENT: Positive for congestion, ear pain and sinus pressure. Negative for sore throat.   Respiratory: Negative for cough and shortness of breath.   Cardiovascular: Negative for chest pain.       Past Medical History  Diagnosis Date  . Hx of colonic polyps     adenomatous  . Hyperlipidemia   . Hypertension   . Osteoarthritis     no per pt 06-17-14    Social History   Social History  . Marital Status: Legally Separated    Spouse Name: N/A  . Number of Children: 2  . 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 Smoking status: Never Smoker   . Smokeless tobacco: Never Used  . Alcohol Use: 0.0 oz/week    0 Standard drinks or equivalent per week     Comment: occasional  . Drug Use: No  . Sexual Activity: Not Currently   Other Topics Concern  . Not on file   Social History Narrative    Past Surgical History  Procedure Laterality Date  . Vaginal delivery      x2  . Tubal ligation    . Patellar tendon repair  1999    left, spurs removed  . Colonoscopy      Family History  Problem Relation Age of Onset  . Diabetes Mother   . Stroke Mother   . Peripheral vascular disease  Mother   . Coronary artery disease Father   . Diabetes Father   . Heart disease Father   . Brain cancer Maternal Grandmother   . Hypertension Neg Hx   . Hearing loss Neg Hx   . Colon cancer Neg Hx   . Esophageal cancer Neg Hx   . Stomach cancer Neg Hx   . Rectal cancer Neg Hx     No Known Allergies  Current Outpatient Prescriptions on File Prior to Visit  Medication Sig Dispense Refill  . atorvastatin (LIPITOR) 20 MG tablet TAKE 1 TABLET BY MOUTH DAILY. 90 tablet 3  . hydrochlorothiazide (MICROZIDE) 12.5 MG capsule TAKE 1 CAPSULE BY MOUTH EVERY MORNING 90 capsule 3  . Multiple Vitamin (MULTIVITAMIN) capsule Take 1 capsule by mouth daily.     No current facility-administered medications on file prior to visit.    BP 142/78 mmHg  Pulse 76  Temp(Src) 97.8 F (36.6 C) (Oral)  Ht 5\' 6"  (1.676 m)  Wt 218 lb 12.8 oz (99.247 kg)  BMI 35.33 kg/m2  SpO2 97%    Objective:   Physical Exam  Constitutional: She appears well-nourished.  HENT:  Right Ear: Tympanic membrane and ear canal normal.  Left Ear: Tympanic membrane and  ear canal normal.  Nose: Right sinus exhibits no maxillary sinus tenderness and no frontal sinus tenderness. Left sinus exhibits maxillary sinus tenderness. Left sinus exhibits no frontal sinus tenderness.  Mouth/Throat: Oropharynx is clear and moist.  Slight dullness to bilateral TM's, no erythema or bulging. Mild tenderness upon palpation to left maxillary sinus  Eyes: Conjunctivae are normal.  Neck: Neck supple.  Cardiovascular: Normal rate and regular rhythm.   Pulmonary/Chest: Effort normal and breath sounds normal.  Lymphadenopathy:    She has no cervical adenopathy.  Skin: Skin is warm and dry.          Assessment & Plan:  Ear pain:  Present for 2-3 weeks with mild sinus presusre to left side. Exam unremarkable. No adenopathy, tenderness, or infection noted to ears. Mild tenderness to left maxillary sinus. Appears well. Does not feel  ill. Suspect allergy involvement and will treat with supportive measures. Flonase, ibuprofen, Zyrtec HS. She is to follow up if no improvement.

## 2015-07-14 ENCOUNTER — Encounter: Payer: Self-pay | Admitting: Gynecology

## 2015-07-14 ENCOUNTER — Telehealth: Payer: Self-pay | Admitting: *Deleted

## 2015-07-14 ENCOUNTER — Ambulatory Visit (INDEPENDENT_AMBULATORY_CARE_PROVIDER_SITE_OTHER): Payer: BLUE CROSS/BLUE SHIELD | Admitting: Gynecology

## 2015-07-14 VITALS — BP 136/90

## 2015-07-14 DIAGNOSIS — N63 Unspecified lump in breast: Secondary | ICD-10-CM

## 2015-07-14 DIAGNOSIS — N6320 Unspecified lump in the left breast, unspecified quadrant: Secondary | ICD-10-CM | POA: Insufficient documentation

## 2015-07-14 NOTE — Progress Notes (Signed)
   Patient is a 60 year old who presented to the office today stating that approximately 3-4 days ago she noticed a lump on her left breast. Patient denied any recent injury or trauma. She denies any nipple discharge. She states she has no family history of any breast cancer. Her mammogram from June 2016 was reviewed patient had a three-dimensional mammogram which was normal. Patient is not on any hormone replacement therapy. She stated that she reached her menarche at the age of 84 and her menopause late 31s.  Exam: Both breasts were examined sitting supine position. Both breasts were pendulous. No skin discoloration or no nipple inversion no supraclavicular axillary lymphadenopathy on either breast. Right breast no palpable masses or tenderness. Left breast there was a nodularity approximate 1 cm in size firm non-mobile slightly tender 4 finger breast from the areolar region at the 11:00 position.  Assessment/plan: Left breast recently detected by patient at home 1 self breast examination confirmed on exam today. Patient will be referred to the radiologist for diagnostic mammogram and ultrasound and biopsy as indicated.

## 2015-07-14 NOTE — Telephone Encounter (Signed)
-----   Message from Terrance Mass, MD sent at 07/14/2015 10:26 AM EST ----- Anderson Malta, please schedule appointment for this patient has Tanya Gomez. She needs a diagnostic mammogram of the left breast. Palpable mass 3 finger breast from the areolar region at the 11:00 position. Mass is 1 cm and indurated and firm. Patient had a mammogram same facility June 2016

## 2015-07-14 NOTE — Telephone Encounter (Signed)
Appointment on 07/21/15 @ 10:45am pt aware,order faxed

## 2015-07-21 ENCOUNTER — Other Ambulatory Visit: Payer: Self-pay | Admitting: Radiology

## 2015-07-22 ENCOUNTER — Encounter: Payer: Self-pay | Admitting: Gynecology

## 2015-07-31 ENCOUNTER — Encounter: Payer: Self-pay | Admitting: Gynecology

## 2015-08-06 ENCOUNTER — Telehealth: Payer: Self-pay

## 2015-08-06 NOTE — Telephone Encounter (Signed)
This patient will need an office visit as it has been over 1 month since her last evaluation. She may follow up with her PCP or myself.

## 2015-08-06 NOTE — Telephone Encounter (Signed)
Pt left v/m; pt was seen 07/02/15 and has been using flonase,zyrtec and ibuprofen. No improvement with ears and sinus and pt request med sent to [facility]. Pt request cb.

## 2015-08-07 NOTE — Telephone Encounter (Signed)
Called and notified patient of Kate's comments. Patient verbalized understanding.  

## 2015-08-14 ENCOUNTER — Encounter: Payer: Self-pay | Admitting: Family Medicine

## 2015-08-14 ENCOUNTER — Ambulatory Visit (INDEPENDENT_AMBULATORY_CARE_PROVIDER_SITE_OTHER): Payer: BLUE CROSS/BLUE SHIELD | Admitting: Family Medicine

## 2015-08-14 VITALS — BP 130/80 | HR 76 | Temp 98.3°F | Ht 66.0 in | Wt 218.0 lb

## 2015-08-14 DIAGNOSIS — J3089 Other allergic rhinitis: Secondary | ICD-10-CM | POA: Diagnosis not present

## 2015-08-14 DIAGNOSIS — J309 Allergic rhinitis, unspecified: Secondary | ICD-10-CM | POA: Insufficient documentation

## 2015-08-14 DIAGNOSIS — H6062 Unspecified chronic otitis externa, left ear: Secondary | ICD-10-CM | POA: Diagnosis not present

## 2015-08-14 MED ORDER — CIPROFLOXACIN-DEXAMETHASONE 0.3-0.1 % OT SUSP: 4.0000 [drp] | Freq: Two times a day (BID) | OTIC | Status: AC

## 2015-08-14 NOTE — Assessment & Plan Note (Signed)
Treat with topical antibiotics. Keep ears dry.  Call if not improving as expected.

## 2015-08-14 NOTE — Patient Instructions (Addendum)
Start antibiotics ear drops in left ear as directed.  Can use flonase 2 sprays per nostril daily for nasal congestion and sinus pressure. Stop afrin. Call if not improving as expected.

## 2015-08-14 NOTE — Progress Notes (Signed)
   Subjective:    Patient ID: Tanya Gomez, female    DOB: [birth year], 60 y.o.   MRN: [id number]  HPI  60 year old female presents with continue left ear pain x 4-5 weeks. Not getting better or worse.  Saw PA here and told ETD, no infection.. Told decongestant and afrin.Marland Kitchen Helps some but then comes back.  Never used Flonase, ibuprofen, Zyrtec as recommended by PA..  Pain in left ear and down into left neck. Pressure in head.  No fever, feels warm at night, dry mild  cough , no congestion. Left sinus pain and pressure.  Some sneeze.  Social History /Family History/Past Medical History reviewed and updated if needed. No history of recurrent sinus issues, no past sinus surgery.  Nonsmoker. Review of Systems  Constitutional: Negative for fever and fatigue.  HENT: Negative for ear pain.   Eyes: Negative for pain.  Respiratory: Negative for chest tightness and shortness of breath.   Cardiovascular: Negative for chest pain, palpitations and leg swelling.  Gastrointestinal: Negative for abdominal pain.  Genitourinary: Negative for dysuria.       Objective:   Physical Exam  Constitutional: Vital signs are normal. She appears well-developed and well-nourished. She is cooperative.  Non-toxic appearance. She does not appear ill. No distress.  HENT:  Head: Normocephalic.  Right Ear: Hearing, tympanic membrane, external ear and ear canal normal. Tympanic membrane is not erythematous, not retracted and not bulging. No middle ear effusion.  Left Ear: Hearing, tympanic membrane and ear canal normal. There is swelling. Tympanic membrane is not erythematous, not retracted and not bulging.  No middle ear effusion.  Nose: Mucosal edema and rhinorrhea present. Right sinus exhibits no maxillary sinus tenderness and no frontal sinus tenderness. Left sinus exhibits no maxillary sinus tenderness and no frontal sinus tenderness.  Mouth/Throat: Uvula is midline, oropharynx is clear and moist and mucous  membranes are normal.  Swelling and redness in left ear canal  Eyes: Conjunctivae, EOM and lids are normal. Pupils are equal, round, and reactive to light. Lids are everted and swept, no foreign bodies found.  Neck: Trachea normal and normal range of motion. Neck supple. Carotid bruit is not present. No thyroid mass and no thyromegaly present.  Cardiovascular: Normal rate, regular rhythm, S1 normal, S2 normal, normal heart sounds, intact distal pulses and normal pulses.  Exam reveals no gallop and no friction rub.   No murmur heard. Pulmonary/Chest: Effort normal and breath sounds normal. No tachypnea. No respiratory distress. She has no decreased breath sounds. She has no wheezes. She has no rhonchi. She has no rales.  Neurological: She is alert.  Skin: Skin is warm, dry and intact. No rash noted.  Psychiatric: Her speech is normal and behavior is normal. Judgment normal. Her mood appears not anxious. Cognition and memory are normal. She does not exhibit a depressed mood.          Assessment & Plan:

## 2015-08-14 NOTE — Assessment & Plan Note (Signed)
Stop afrin over use and change to flonase.

## 2015-08-14 NOTE — Progress Notes (Signed)
Pre visit review using our clinic review tool, if applicable. No additional management support is needed unless otherwise documented below in the visit note. 

## 2016-01-03 ENCOUNTER — Other Ambulatory Visit: Payer: Self-pay | Admitting: Gynecology

## 2016-01-03 ENCOUNTER — Encounter: Payer: Self-pay | Admitting: Gynecology

## 2016-01-04 ENCOUNTER — Ambulatory Visit (INDEPENDENT_AMBULATORY_CARE_PROVIDER_SITE_OTHER): Payer: BLUE CROSS/BLUE SHIELD | Admitting: Gynecology

## 2016-01-04 ENCOUNTER — Encounter: Payer: Self-pay | Admitting: Gynecology

## 2016-01-04 ENCOUNTER — Encounter: Payer: BLUE CROSS/BLUE SHIELD | Admitting: Gynecology

## 2016-01-04 VITALS — BP 134/88 | Ht 66.5 in | Wt 212.0 lb

## 2016-01-04 DIAGNOSIS — L304 Erythema intertrigo: Secondary | ICD-10-CM

## 2016-01-04 DIAGNOSIS — Z01419 Encounter for gynecological examination (general) (routine) without abnormal findings: Secondary | ICD-10-CM | POA: Diagnosis not present

## 2016-01-04 DIAGNOSIS — Z860100 Personal history of colon polyps, unspecified: Secondary | ICD-10-CM

## 2016-01-04 DIAGNOSIS — M8580 Other specified disorders of bone density and structure, unspecified site: Secondary | ICD-10-CM

## 2016-01-04 MED ORDER — NYSTATIN-TRIAMCINOLONE 100000-0.1 UNIT/GM-% EX CREA: 1.0000 "application " | TOPICAL_CREAM | Freq: Three times a day (TID) | CUTANEOUS | 2 refills | Status: DC

## 2016-01-04 NOTE — Progress Notes (Signed)
Tanya Gomez 04-13-1956 [id number]   History:    60 y.o.  for annual gyn exam with no complaints today. Patient is menopausal on no hormone replacement therapy. Patient with no previous history of any abnormal Pap smear.Her primary physician is Dr. Silvio Pate who has been treating her for her hyperlipidemia. Patient with known history of colon polyps had a benign colonoscopy in 2016.Patient's last bone density study was in 2015 the lowest T score was -1.1 at the right femoral neck and with a normal Frax analysis and stable bone mineralization when compare with previous study of 2012.  Past medical history,surgical history, family history and social history were all reviewed and documented in the EPIC chart.  Gynecologic History No LMP recorded. Patient is postmenopausal. Contraception: post menopausal status Last Pap: 2015. Results were: normal Last mammogram: 2017. Results were: Has follow-up this month  Obstetric History OB History  Gravida Para Term Preterm AB Living  2 2       2   SAB TAB Ectopic Multiple Live Births               # Outcome Date GA Lbr Len/2nd Weight Sex Delivery Anes PTL Lv  2 Para      Vag-Spont     1 Para      Vag-Spont          ROS: A ROS was performed and pertinent positives and negatives are included in the history.  GENERAL: No fevers or chills. HEENT: No change in vision, no earache, sore throat or sinus congestion. NECK: No pain or stiffness. CARDIOVASCULAR: No chest pain or pressure. No palpitations. PULMONARY: No shortness of breath, cough or wheeze. GASTROINTESTINAL: No abdominal pain, nausea, vomiting or diarrhea, melena or bright red blood per rectum. GENITOURINARY: No urinary frequency, urgency, hesitancy or dysuria. MUSCULOSKELETAL: No joint or muscle pain, no back pain, no recent trauma. DERMATOLOGIC: No rash, no itching, no lesions. ENDOCRINE: No polyuria, polydipsia, no heat or cold intolerance. No recent change in weight. HEMATOLOGICAL: No  anemia or easy bruising or bleeding. NEUROLOGIC: No headache, seizures, numbness, tingling or weakness. PSYCHIATRIC: No depression, no loss of interest in normal activity or change in sleep pattern.     Exam: chaperone present  BP 134/88   Ht 5' 6.5" (1.689 m)   Wt 212 lb (96.2 kg)   BMI 33.71 kg/m   Body mass index is 33.71 kg/m.  General appearance : Well developed well nourished female. No acute distress HEENT: Eyes: no retinal hemorrhage or exudates,  Neck supple, trachea midline, no carotid bruits, no thyroidmegaly Lungs: Clear to auscultation, no rhonchi or wheezes, or rib retractions  Heart: Regular rate and rhythm, no murmurs or gallops Breast:Examined in sitting and supine position were symmetrical in appearance, no palpable masses or tenderness,  intertrigo underneath right breast, no nipple inversion, no nipple discharge, no skin discoloration, no axillary or supraclavicular lymphadenopathy Abdomen: no palpable masses or tenderness, no rebound or guarding Extremities: no edema or skin discoloration or tenderness  Pelvic:  Bartholin, Urethra, Skene Glands: Within normal limits             Vagina: No gross lesions or discharge, atrophic changes  Cervix: No gross lesions or discharge  Uterus  anteverted, normal size, shape and consistency, non-tender and mobile  Adnexa  Without masses or tenderness  Anus and perineum  normal   Rectovaginal  normal sphincter tone without palpated masses or tenderness  Hemoccult cards provided     Assessment/Plan:  60 y.o. female for annual exam will return to the office in the next couple of weeks for her bone density study. I've asked her to bring with her the Hemoccult cards for testing. We discussed importance of calcium vitamin D and weightbearing exercises for osteoporosis prevention. For her right breast intertrigo I'm going to prescribe mytrex cream to apply 2-3 times a day for 5-7 days. Pap smear not  indicated.   Terrance Mass MD, 8:52 AM 01/04/2016

## 2016-01-04 NOTE — Patient Instructions (Signed)
Intertrigo Intertrigo is a skin condition that occurs in between folds of skin in places on the body that rub together a lot and do not get much ventilation. It is caused by heat, moisture, friction, sweat retention, and lack of air circulation, which produces red, irritated patches and, sometimes, scaling or drainage. People who have diabetes, who are obese, or who have treatment with antibiotics are at increased risk for intertrigo. The most common sites for intertrigo to occur include:  The groin.  The breasts.  The armpits.  Folds of abdominal skin.  Webbed spaces between the fingers or toes. Intertrigo may be aggravated by:  Sweat.  Feces.  Yeast or bacteria that are present near skin folds.  Urine.  Vaginal discharge. HOME CARE INSTRUCTIONS  The following steps can be taken to reduce friction and keep the affected area cool and dry:  Expose skin folds to the air.  Keep deep skin folds separated with cotton or linen cloth. Avoid tight fitting clothing that could cause chafing.  Wear open-toed shoes or sandals to help reduce moisture between the toes.  Apply absorbent powders to affected areas as directed by your caregiver.  Apply over-the-counter barrier pastes, such as zinc oxide, as directed by your caregiver.  If you develop a fungal infection in the affected area, your caregiver may have you use antifungal creams. SEEK MEDICAL CARE IF:   The rash is not improving after 1 week of treatment.  The rash is getting worse (more red, more swollen, more painful, or spreading).  You have a fever or chills. MAKE SURE YOU:   Understand these instructions.  Will watch your condition.  Will get help right away if you are not doing well or get worse.   This information is not intended to replace advice given to you by your health care provider. Make sure you discuss any questions you have with your health care provider.   Document Released: 05/30/2005 Document  Revised: 08/22/2011 Document Reviewed: 12/01/2014 Elsevier Interactive Patient Education 2016 Elsevier Inc.  

## 2016-01-07 ENCOUNTER — Other Ambulatory Visit: Payer: Self-pay | Admitting: Gynecology

## 2016-01-07 DIAGNOSIS — M8580 Other specified disorders of bone density and structure, unspecified site: Secondary | ICD-10-CM

## 2016-01-27 ENCOUNTER — Encounter: Payer: Self-pay | Admitting: Gynecology

## 2016-02-02 ENCOUNTER — Ambulatory Visit (INDEPENDENT_AMBULATORY_CARE_PROVIDER_SITE_OTHER): Payer: BLUE CROSS/BLUE SHIELD

## 2016-02-02 ENCOUNTER — Encounter: Payer: Self-pay | Admitting: Gynecology

## 2016-02-02 ENCOUNTER — Other Ambulatory Visit: Payer: Self-pay | Admitting: Gynecology

## 2016-02-02 DIAGNOSIS — M8580 Other specified disorders of bone density and structure, unspecified site: Secondary | ICD-10-CM

## 2016-02-02 DIAGNOSIS — M899 Disorder of bone, unspecified: Secondary | ICD-10-CM

## 2016-02-02 DIAGNOSIS — Z13820 Encounter for screening for osteoporosis: Secondary | ICD-10-CM | POA: Diagnosis not present

## 2016-02-04 ENCOUNTER — Other Ambulatory Visit: Payer: Self-pay | Admitting: *Deleted

## 2016-02-04 DIAGNOSIS — M8580 Other specified disorders of bone density and structure, unspecified site: Secondary | ICD-10-CM

## 2016-02-05 ENCOUNTER — Encounter: Payer: Self-pay | Admitting: Internal Medicine

## 2016-02-05 ENCOUNTER — Ambulatory Visit (INDEPENDENT_AMBULATORY_CARE_PROVIDER_SITE_OTHER): Payer: BLUE CROSS/BLUE SHIELD | Admitting: Internal Medicine

## 2016-02-05 DIAGNOSIS — J0140 Acute pansinusitis, unspecified: Secondary | ICD-10-CM | POA: Diagnosis not present

## 2016-02-05 MED ORDER — AMOXICILLIN 500 MG PO TABS: 1000.0000 mg | ORAL_TABLET | Freq: Two times a day (BID) | ORAL | 0 refills | Status: AC

## 2016-02-05 NOTE — Progress Notes (Signed)
   Subjective:    Patient ID: Tanya Gomez, female    DOB: Aug 13, 1955, 60 y.o.   MRN: [id number]  HPI Here due to respiratory illness  Has had sinus headache and hacky cough Now pain along left neck and behind left ear Sick for a week ?low grade fever No chills or sweats Slight green and yellow sputum--may be from PND Does have frontal headache --some maxillary No SOB Some left ear pain  mucinex cold and other OTC meds---brief help  Current Outpatient Prescriptions on File Prior to Visit  Medication Sig Dispense Refill  . atorvastatin (LIPITOR) 20 MG tablet TAKE 1 TABLET BY MOUTH DAILY. 90 tablet 3  . hydrochlorothiazide (MICROZIDE) 12.5 MG capsule TAKE 1 CAPSULE BY MOUTH EVERY MORNING 90 capsule 3  . Multiple Vitamin (MULTIVITAMIN) capsule Take 1 capsule by mouth daily.    Marland Kitchen nystatin-triamcinolone (MYCOLOG II) cream Apply 1 application topically 3 (three) times daily. 30 g 2   No current facility-administered medications on file prior to visit.     No Known Allergies  Past Medical History:  Diagnosis Date  . Hx of colonic polyps    adenomatous  . Hyperlipidemia   . Hypertension   . Osteoarthritis    no per pt 06-17-14  . Osteopenia     Past Surgical History:  Procedure Laterality Date  . COLONOSCOPY    . PATELLAR TENDON REPAIR  1999   left, spurs removed  . TUBAL LIGATION    . VAGINAL DELIVERY     x2    Family History  Problem Relation Age of Onset  . Diabetes Mother   . Stroke Mother   . Peripheral vascular disease Mother   . Coronary artery disease Father   . Diabetes Father   . Heart disease Father   . Brain cancer Maternal Grandmother   . Cancer Maternal Grandmother     brain  . Hypertension Neg Hx   . Hearing loss Neg Hx   . Colon cancer Neg Hx   . Esophageal cancer Neg Hx   . Stomach cancer Neg Hx   . Rectal cancer Neg Hx     Social History   Social History  . Marital status: Legally Separated    Spouse name: N/A  . Number of  children: 2  . Years of education: N/A   Occupational History  . 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 Smoking status: Never Smoker  . Smokeless tobacco: Never Used  . Alcohol use 0.0 oz/week     Comment: occasional  . Drug use: No  . Sexual activity: Not Currently   Other Topics Concern  . Not on file   Social History Narrative  . No narrative on file   Review of Systems No rash No vomiting or diarrhea Appetite is okay    Objective:   Physical Exam  Constitutional: She appears well-developed and well-nourished. No distress.  HENT:  Mouth/Throat: Oropharynx is clear and moist. No oropharyngeal exudate.  No sinus tenderness Moderate nasal swelling TMs normal  Neck: Normal range of motion. Neck supple. No thyromegaly present.  Pulmonary/Chest: Effort normal and breath sounds normal. No respiratory distress. She has no wheezes. She has no rales.  Lymphadenopathy:    She has no cervical adenopathy.  Skin: No rash noted.          Assessment & Plan:

## 2016-02-05 NOTE — Patient Instructions (Signed)
Please start the antibiotic if you are worsening in the next few days. 

## 2016-02-05 NOTE — Assessment & Plan Note (Signed)
May still be viral  Discussed supportive care Start antibiotic if worsening in the next few days

## 2016-02-05 NOTE — Progress Notes (Signed)
Pre visit review using our clinic review tool, if applicable. No additional management support is needed unless otherwise documented below in the visit note. 

## 2016-02-09 ENCOUNTER — Other Ambulatory Visit: Payer: BLUE CROSS/BLUE SHIELD

## 2016-02-09 DIAGNOSIS — M8580 Other specified disorders of bone density and structure, unspecified site: Secondary | ICD-10-CM

## 2016-02-09 LAB — CALCIUM: Calcium: 9.3 mg/dL (ref 8.6–10.4)

## 2016-02-10 LAB — VITAMIN D 25 HYDROXY (VIT D DEFICIENCY, FRACTURES): Vit D, 25-Hydroxy: 32 ng/mL (ref 30–100)

## 2016-02-23 ENCOUNTER — Other Ambulatory Visit: Payer: Self-pay | Admitting: Anesthesiology

## 2016-02-23 DIAGNOSIS — Z1211 Encounter for screening for malignant neoplasm of colon: Secondary | ICD-10-CM

## 2016-03-08 ENCOUNTER — Encounter: Payer: Self-pay | Admitting: Family Medicine

## 2016-03-08 ENCOUNTER — Ambulatory Visit (INDEPENDENT_AMBULATORY_CARE_PROVIDER_SITE_OTHER): Payer: BLUE CROSS/BLUE SHIELD | Admitting: Family Medicine

## 2016-03-08 VITALS — BP 128/78 | HR 63 | Temp 97.4°F | Ht 66.5 in | Wt 208.5 lb

## 2016-03-08 DIAGNOSIS — J3089 Other allergic rhinitis: Secondary | ICD-10-CM | POA: Diagnosis not present

## 2016-03-08 DIAGNOSIS — H1131 Conjunctival hemorrhage, right eye: Secondary | ICD-10-CM | POA: Diagnosis not present

## 2016-03-08 DIAGNOSIS — H6982 Other specified disorders of Eustachian tube, left ear: Secondary | ICD-10-CM

## 2016-03-08 DIAGNOSIS — H6980 Other specified disorders of Eustachian tube, unspecified ear: Secondary | ICD-10-CM | POA: Insufficient documentation

## 2016-03-08 DIAGNOSIS — H6992 Unspecified Eustachian tube disorder, left ear: Secondary | ICD-10-CM

## 2016-03-08 MED ORDER — FLUTICASONE PROPIONATE 50 MCG/ACT NA SUSP: 2.0000 | Freq: Every day | NASAL | 6 refills | Status: DC

## 2016-03-08 NOTE — Progress Notes (Signed)
Subjective:    Patient ID: Tanya Gomez, female    DOB: [birth year], 60 y.o.   MRN: [id number]  HPI Got up this am - eye turned red (right eye medial)-after blow drying hair  No pain or vision change  No drainage   L ear has been bothering her for a while  Sore under and behind it  Pressure sensation  A little runny nose and drainage  Some hay fever  No allergy medicine  No drainage from ear  No swimming    Patient Active Problem List   Diagnosis Date Noted  . ETD (eustachian tube dysfunction) 03/08/2016  . Conjunctival hemorrhage of right eye 03/08/2016  . Acute non-recurrent pansinusitis 02/05/2016  . Osteopenia 01/04/2016  . Chronic otitis externa of left ear 08/14/2015  . Allergic rhinitis 08/14/2015  . Breast mass, left 07/14/2015  . Sensory loss 04/24/2015  . Obesity 04/11/2012  . Routine general medical examination at a health care facility 04/12/2011  . Psoriasis 04/12/2011  . Essential hypertension, benign 10/11/2010  . POSTMENOPAUSAL STATUS 04/29/2009  . TINNITUS 01/21/2009  . Hyperlipemia 10/05/2007  . OSTEOARTHRITIS 10/05/2007  . H/O adenomatous polyp of colon 10/05/2007   Past Medical History:  Diagnosis Date  . Hx of colonic polyps    adenomatous  . Hyperlipidemia   . Hypertension   . Osteoarthritis    no per pt 06-17-14  . Osteopenia    Past Surgical History:  Procedure Laterality Date  . COLONOSCOPY    . PATELLAR TENDON REPAIR  1999   left, spurs removed  . TUBAL LIGATION    . VAGINAL DELIVERY     x2   Social History  Substance Use Topics  . Smoking status: Never Smoker  . Smokeless tobacco: Never Used  . Alcohol use 0.0 oz/week     Comment: occasional   Family History  Problem Relation Age of Onset  . Diabetes Mother   . Stroke Mother   . Peripheral vascular disease Mother   . Coronary artery disease Father   . Diabetes Father   . Heart disease Father   . Brain cancer Maternal Grandmother   . Cancer Maternal Grandmother     brain  . Hypertension Neg Hx   . Hearing loss Neg Hx   . Colon cancer Neg Hx   . Esophageal cancer Neg Hx   . Stomach cancer Neg Hx   . Rectal cancer Neg Hx    No Known Allergies Current Outpatient Prescriptions on File Prior to Visit  Medication Sig Dispense Refill  . atorvastatin (LIPITOR) 20 MG tablet TAKE 1 TABLET BY MOUTH DAILY. 90 tablet 3  . hydrochlorothiazide (MICROZIDE) 12.5 MG capsule TAKE 1 CAPSULE BY MOUTH EVERY MORNING 90 capsule 3  . Multiple Vitamin (MULTIVITAMIN) capsule Take 1 capsule by mouth daily.    Marland Kitchen nystatin-triamcinolone (MYCOLOG II) cream Apply 1 application topically 3 (three) times daily. 30 g 2   No current facility-administered medications on file prior to visit.     Review of Systems Review of Systems  Constitutional: Negative for fever, appetite change, fatigue and unexpected weight change.  Eyes: Negative for pain and visual disturbance. pos for redness in R eye ENT pos for fullness in L ear , neg for sinus pain  Respiratory: Negative for cough and shortness of breath.   Cardiovascular: Negative for cp or palpitations    Gastrointestinal: Negative for nausea, diarrhea and constipation.  Genitourinary: Negative for urgency and frequency.  Skin: Negative for pallor or  rash   Neurological: Negative for weakness, light-headedness, numbness and headaches.  Hematological: Negative for adenopathy. Does not bruise/bleed easily.  Psychiatric/Behavioral: Negative for dysphoric mood. The patient is not nervous/anxious.         Objective:   Physical Exam  Constitutional: She appears well-developed and well-nourished. No distress.  overwt and well app  HENT:  Head: Normocephalic and atraumatic.  Right Ear: External ear normal.  Mouth/Throat: Oropharynx is clear and moist.  Nares are boggy Scant clear pnd R ear nl appearing  L  TM dull , no erythema or bulging, canal is clear w/o edema or drainage or tenderness  Eyes: EOM are normal. Pupils are equal,  round, and reactive to light. Right eye exhibits no discharge. Left eye exhibits no discharge. No scleral icterus.  Conj hemorrhage in R eye- medial /covering about 1/3 of sclera No swelling/ lid change/ pupil change or vision change  Neck: Normal range of motion. Neck supple.  Cardiovascular: Normal rate and regular rhythm.   Pulmonary/Chest: Effort normal and breath sounds normal.  Lymphadenopathy:    She has no cervical adenopathy.  Neurological: She is alert. No cranial nerve deficit.  Skin: Skin is warm and dry. No rash noted. No erythema. No pallor.  Psychiatric: She has a normal mood and affect.          Assessment & Plan:   Problem List Items Addressed This Visit      Respiratory   Allergic rhinitis - Primary     Nervous and Auditory   ETD (eustachian tube dysfunction)    L ear- I suspect intermittent and rel to seasonal allergies tx with flonase ns through allergy season inst in use  Update if not starting to improve in a week or if worsening          Other   Conjunctival hemorrhage of right eye    Medial Pt is not on anticoag Happened this am after blow drying hair (? If straining)  No discomfort/swelling or vision change  Re assured-will improve gradaually inst to alert [place] if any new symptoms start or if this worsens       Other Visit Diagnoses   None.

## 2016-03-08 NOTE — Patient Instructions (Signed)
The conjunctival hematoma in your eye should improve gradually  If you develop pain or vision change or other symptoms- alert [place]  For the ear- start flonase nasal spray through the allergy season   Update if not starting to improve in a week or if worsening   If ear pain or drainage develop -alert [place]

## 2016-03-08 NOTE — Progress Notes (Signed)
Pre visit review using our clinic review tool, if applicable. No additional management support is needed unless otherwise documented below in the visit note. 

## 2016-03-09 NOTE — Assessment & Plan Note (Signed)
Medial Pt is not on anticoag Happened this am after blow drying hair (? If straining)  No discomfort/swelling or vision change  Re assured-will improve gradaually inst to alert [place] if any new symptoms start or if this worsens

## 2016-03-09 NOTE — Assessment & Plan Note (Signed)
L ear- I suspect intermittent and rel to seasonal allergies tx with flonase ns through allergy season inst in use  Update if not starting to improve in a week or if worsening

## 2016-04-25 ENCOUNTER — Encounter: Payer: BLUE CROSS/BLUE SHIELD | Admitting: Internal Medicine

## 2016-05-03 ENCOUNTER — Encounter: Payer: Self-pay | Admitting: Internal Medicine

## 2016-05-03 ENCOUNTER — Ambulatory Visit (INDEPENDENT_AMBULATORY_CARE_PROVIDER_SITE_OTHER): Payer: BLUE CROSS/BLUE SHIELD | Admitting: Internal Medicine

## 2016-05-03 VITALS — BP 128/86 | HR 75 | Temp 98.1°F | Ht 66.75 in | Wt 202.0 lb

## 2016-05-03 DIAGNOSIS — Z0000 Encounter for general adult medical examination without abnormal findings: Secondary | ICD-10-CM | POA: Diagnosis not present

## 2016-05-03 DIAGNOSIS — H6982 Other specified disorders of Eustachian tube, left ear: Secondary | ICD-10-CM | POA: Diagnosis not present

## 2016-05-03 DIAGNOSIS — H6992 Unspecified Eustachian tube disorder, left ear: Secondary | ICD-10-CM

## 2016-05-03 DIAGNOSIS — Z23 Encounter for immunization: Secondary | ICD-10-CM

## 2016-05-03 DIAGNOSIS — E785 Hyperlipidemia, unspecified: Secondary | ICD-10-CM

## 2016-05-03 DIAGNOSIS — I10 Essential (primary) hypertension: Secondary | ICD-10-CM

## 2016-05-03 LAB — LIPID PANEL
CHOL/HDL RATIO: 3
Cholesterol: 159 mg/dL (ref 0–200)
HDL: 62.3 mg/dL (ref >39.00)
LDL Cholesterol: 80 mg/dL (ref 0–99)
NONHDL: 96.36
Triglycerides: 81 mg/dL (ref 0.0–149.0)
VLDL: 16.2 mg/dL (ref 0.0–40.0)

## 2016-05-03 LAB — CBC WITH DIFFERENTIAL/PLATELET
BASOS ABS: 0 10*3/uL (ref 0.0–0.1)
BASOS PCT: 0.5 % (ref 0.0–3.0)
EOS PCT: 1.2 % (ref 0.0–5.0)
Eosinophils Absolute: 0.1 10*3/uL (ref 0.0–0.7)
HEMATOCRIT: 42 % (ref 36.0–46.0)
Hemoglobin: 14.5 g/dL (ref 12.0–15.0)
LYMPHS ABS: 2.5 10*3/uL (ref 0.7–4.0)
LYMPHS PCT: 42.1 % (ref 12.0–46.0)
MCHC: 34.5 g/dL (ref 30.0–36.0)
MCV: 90.9 fl (ref 78.0–100.0)
MONOS PCT: 12.1 % — AB (ref 3.0–12.0)
Monocytes Absolute: 0.7 10*3/uL (ref 0.1–1.0)
NEUTROS ABS: 2.6 10*3/uL (ref 1.4–7.7)
NEUTROS PCT: 44.1 % (ref 43.0–77.0)
PLATELETS: 247 10*3/uL (ref 150.0–400.0)
RBC: 4.62 Mil/uL (ref 3.87–5.11)
RDW: 12.9 % (ref 11.5–15.5)
WBC: 5.9 10*3/uL (ref 4.0–10.5)

## 2016-05-03 LAB — COMPREHENSIVE METABOLIC PANEL
ALT: 24 U/L (ref 0–35)
AST: 25 U/L (ref 0–37)
Albumin: 4.3 g/dL (ref 3.5–5.2)
Alkaline Phosphatase: 73 U/L (ref 39–117)
BILIRUBIN TOTAL: 0.5 mg/dL (ref 0.2–1.2)
BUN: 13 mg/dL (ref 6–23)
CALCIUM: 9.7 mg/dL (ref 8.4–10.5)
CHLORIDE: 102 meq/L (ref 96–112)
CO2: 31 meq/L (ref 19–32)
Creatinine, Ser: 0.76 mg/dL (ref 0.40–1.20)
GFR: 82.35 mL/min (ref >60.00)
GLUCOSE: 89 mg/dL (ref 70–99)
Potassium: 3.8 mEq/L (ref 3.5–5.1)
Sodium: 140 mEq/L (ref 135–145)
Total Protein: 7.4 g/dL (ref 6.0–8.3)

## 2016-05-03 NOTE — Assessment & Plan Note (Signed)
Persistent symptoms Will set up with ENT

## 2016-05-03 NOTE — Assessment & Plan Note (Signed)
UTD on mammogram Tdap and flu vaccines today Has done a good job with fitness Colon due 2021

## 2016-05-03 NOTE — Assessment & Plan Note (Signed)
No problems with statin 

## 2016-05-03 NOTE — Assessment & Plan Note (Signed)
BP Readings from Last 3 Encounters:  05/03/16 128/86  03/08/16 128/78  02/05/16 118/80   Good control

## 2016-05-03 NOTE — Progress Notes (Signed)
Pre visit review using our clinic review tool, if applicable. No additional management support is needed unless otherwise documented below in the visit note. 

## 2016-05-03 NOTE — Progress Notes (Signed)
Subjective:    Patient ID: Tanya Gomez, female    DOB: Oct 04, 1955, 60 y.o.   MRN: [id number]  HPI Here for physical  Still having trouble with her left ear Still has pain behind ear and down neck Nasal spray did help some  Did get divorced No relationship yet---but doing okay Discussed safe sex  Continues on BP med and statin No problems with these  Current Outpatient Prescriptions on File Prior to Visit  Medication Sig Dispense Refill  . atorvastatin (LIPITOR) 20 MG tablet TAKE 1 TABLET BY MOUTH DAILY. 90 tablet 3  . fluticasone (FLONASE) 50 MCG/ACT nasal spray Place 2 sprays into both nostrils daily. 16 g 6  . hydrochlorothiazide (MICROZIDE) 12.5 MG capsule TAKE 1 CAPSULE BY MOUTH EVERY MORNING 90 capsule 3  . Multiple Vitamin (MULTIVITAMIN) capsule Take 1 capsule by mouth daily.    Marland Kitchen nystatin-triamcinolone (MYCOLOG II) cream Apply 1 application topically 3 (three) times daily. 30 g 2   No current facility-administered medications on file prior to visit.     No Known Allergies  Past Medical History:  Diagnosis Date  . Hx of colonic polyps    adenomatous  . Hyperlipidemia   . Hypertension   . Osteoarthritis    no per pt 06-17-14  . Osteopenia     Past Surgical History:  Procedure Laterality Date  . COLONOSCOPY    . PATELLAR TENDON REPAIR  1999   left, spurs removed  . TUBAL LIGATION    . VAGINAL DELIVERY     x2    Family History  Problem Relation Age of Onset  . Diabetes Mother   . Stroke Mother   . Peripheral vascular disease Mother   . Coronary artery disease Father   . Diabetes Father   . Heart disease Father   . Brain cancer Maternal Grandmother   . Cancer Maternal Grandmother     brain  . Hypertension Neg Hx   . Hearing loss Neg Hx   . Colon cancer Neg Hx   . Esophageal cancer Neg Hx   . Stomach cancer Neg Hx   . Rectal cancer Neg Hx     Social History   Social History  . Marital status: Divorced    Spouse name: N/A  . Number of  children: 2  . 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 Smoking status: Never Smoker  . Smokeless tobacco: Never Used  . Alcohol use 0.0 oz/week     Comment: occasional  . Drug use: No  . Sexual activity: Not Currently   Other Topics Concern  . Not on file   Social History Narrative  . No narrative on file   Review of Systems  Constitutional: Negative for fatigue.       Exercising more Lost 13# since last year  HENT: Positive for ear pain, hearing loss and tinnitus. Negative for dental problem and trouble swallowing.        Keeps up with dentist  Eyes: Negative for visual disturbance.       No diplopia or unilateral vision loss  Respiratory: Negative for cough, chest tightness and shortness of breath.   Cardiovascular: Negative for chest pain, palpitations and leg swelling.  Gastrointestinal: Negative for abdominal pain, blood in stool, nausea and vomiting.       No heartburn  Endocrine: Negative for polydipsia and polyuria.  Genitourinary: Negative for  dysuria and hematuria.       No incontinence  Musculoskeletal: Negative for arthralgias, back pain and joint swelling.  Skin:       Psoriasis on hands--has Rx cream for this (Dr Nevada Crane)  Allergic/Immunologic: Negative for environmental allergies and immunocompromised state.  Neurological: Negative for dizziness, syncope, weakness, light-headedness and headaches.  Hematological: Negative for adenopathy. Does not bruise/bleed easily.  Psychiatric/Behavioral: Negative for dysphoric mood and sleep disturbance. The patient is not nervous/anxious.        Objective:   Physical Exam  Constitutional: She is oriented to person, place, and time. She appears well-developed and well-nourished. No distress.  HENT:  Head: Normocephalic and atraumatic.  Right Ear: External ear normal.  Left Ear: External ear normal.  Mouth/Throat:  Oropharynx is clear and moist. No oropharyngeal exudate.  Eyes: Conjunctivae are normal. Pupils are equal, round, and reactive to light.  Neck: Normal range of motion. Neck supple. No thyromegaly present.  Cardiovascular: Normal rate, regular rhythm, normal heart sounds and intact distal pulses.  Exam reveals no gallop.   No murmur heard. Pulmonary/Chest: Effort normal and breath sounds normal. No respiratory distress. She has no wheezes. She has no rales.  Abdominal: Soft. She exhibits no distension. There is no tenderness.  Musculoskeletal: She exhibits no edema or tenderness.  Lymphadenopathy:    She has no cervical adenopathy.  Neurological: She is alert and oriented to person, place, and time.  Skin:  Psoriatic rash on hands and feet---mild  Psychiatric: She has a normal mood and affect. Her behavior is normal.          Assessment & Plan:

## 2016-05-03 NOTE — Addendum Note (Signed)
Addended by: Pilar Grammes on: 05/03/2016 12:27 PM   Modules accepted: Orders

## 2016-05-10 ENCOUNTER — Other Ambulatory Visit: Payer: Self-pay | Admitting: Internal Medicine

## 2016-05-20 ENCOUNTER — Other Ambulatory Visit: Payer: Self-pay | Admitting: Internal Medicine

## 2016-07-21 ENCOUNTER — Encounter: Payer: Self-pay | Admitting: Gynecology

## 2016-10-13 ENCOUNTER — Encounter: Payer: Self-pay | Admitting: Gynecology

## 2016-10-13 ENCOUNTER — Ambulatory Visit (INDEPENDENT_AMBULATORY_CARE_PROVIDER_SITE_OTHER): Payer: BLUE CROSS/BLUE SHIELD | Admitting: Gynecology

## 2016-10-13 VITALS — BP 142/80

## 2016-10-13 DIAGNOSIS — R102 Pelvic and perineal pain: Secondary | ICD-10-CM

## 2016-10-13 DIAGNOSIS — K644 Residual hemorrhoidal skin tags: Secondary | ICD-10-CM | POA: Diagnosis not present

## 2016-10-13 DIAGNOSIS — L292 Pruritus vulvae: Secondary | ICD-10-CM | POA: Diagnosis not present

## 2016-10-13 DIAGNOSIS — N898 Other specified noninflammatory disorders of vagina: Secondary | ICD-10-CM | POA: Diagnosis not present

## 2016-10-13 LAB — URINALYSIS W MICROSCOPIC + REFLEX CULTURE
BILIRUBIN URINE: NEGATIVE
CRYSTALS: NONE SEEN [HPF]
Casts: NONE SEEN [LPF]
GLUCOSE, UA: NEGATIVE
Hgb urine dipstick: NEGATIVE
Ketones, ur: NEGATIVE
Nitrite: NEGATIVE
PH: 5.5 (ref 5.0–8.0)
PROTEIN: NEGATIVE
RBC / HPF: NONE SEEN RBC/HPF (ref <=2)
Specific Gravity, Urine: 1.015 (ref 1.001–1.035)
Yeast: NONE SEEN [HPF]

## 2016-10-13 LAB — WET PREP FOR TRICH, YEAST, CLUE
Clue Cells Wet Prep HPF POC: NONE SEEN
TRICH WET PREP: NONE SEEN
Yeast Wet Prep HPF POC: NONE SEEN

## 2016-10-13 MED ORDER — TINIDAZOLE 500 MG PO TABS
ORAL_TABLET | ORAL | 0 refills | Status: DC
Start: 2016-10-13 — End: 2017-01-04

## 2016-10-13 MED ORDER — FLUCONAZOLE 150 MG PO TABS: 150.0000 mg | ORAL_TABLET | Freq: Once | ORAL | 0 refills | Status: AC

## 2016-10-13 NOTE — Progress Notes (Signed)
   Patient is a 61 year old that presented to the office today with several complaints. One of vulvar pruritus as well as discomfort with hemorrhoid and on and off right lower abdominal discomfort. She denied any dysuria some frequency but no fever, chills, nausea vomiting or back pain and no GI complaints.  Exam: Gen. appearance well-developed well-nourished female with the above-mentioned complaining no acute distress Back: No CVA tenderness Abdomen: Soft nontender no rebound or guarding Pelvic: Bartholin urethra Skene was within normal limits Vagina: No lesions or discharge Cervix: No lesions or discharge Rectal external hemorrhoid hyperemic but non-thrombosed  Wet prep moderate white blood cells few bacteria  Urinalysis few bacteria, 6-tender BDC no red blood cell seen culture pending  Assessment/plan: #1 non-thrombosed external hemorrhoid patient to apply Preparation H twice a day and also to do sitz baths in the evening if no improvement within a week she will call the office and we'll schedule an appointment with a general surgery for possible hemorrhoid banding. Also encouraged increased fiber intake and fluid intake. #2 patient symptoms consistent with yeast vaginitis although no yeast was identified but her external pruritus I'm going to call in a prescription of Diflucan 150 mg to take 1 by mouth today #3 because of the amount of bacterial white blood cells in the vagina were going to treat her for suspected underlying BV with Tindamax 500 mg 4 tablets today and repeat in 24 hours #4 patient to schedule annual exam July this year.

## 2016-10-13 NOTE — Patient Instructions (Addendum)
Hemorrhoids Hemorrhoids are swollen veins in and around the rectum or anus. There are two types of hemorrhoids:  Internal hemorrhoids. These occur in the veins that are just inside the rectum. They may poke through to the outside and become irritated and painful.  External hemorrhoids. These occur in the veins that are outside of the anus and can be felt as a painful swelling or hard lump near the anus. Most hemorrhoids do not cause serious problems, and they can be managed with home treatments such as diet and lifestyle changes. If home treatments do not help your symptoms, procedures can be done to shrink or remove the hemorrhoids. What are the causes? This condition is caused by increased pressure in the anal area. This pressure may result from various things, including:  Constipation.  Straining to have a bowel movement.  Diarrhea.  Pregnancy.  Obesity.  Sitting for long periods of time.  Heavy lifting or other activity that causes you to strain.  Anal sex. What are the signs or symptoms? Symptoms of this condition include:  Pain.  Anal itching or irritation.  Rectal bleeding.  Leakage of stool (feces).  Anal swelling.  One or more lumps around the anus. How is this diagnosed? This condition can often be diagnosed through a visual exam. Other exams or tests may also be done, such as:  Examination of the rectal area with a gloved hand (digital rectal exam).  Examination of the anal canal using a small tube (anoscope).  A blood test, if you have lost a significant amount of blood.  A test to look inside the colon (sigmoidoscopy or colonoscopy). How is this treated? This condition can usually be treated at home. However, various procedures may be done if dietary changes, lifestyle changes, and other home treatments do not help your symptoms. These procedures can help make the hemorrhoids smaller or remove them completely. Some of these procedures involve surgery,  and others do not. Common procedures include:  Rubber band ligation. Rubber bands are placed at the base of the hemorrhoids to cut off the blood supply to them.  Sclerotherapy. Medicine is injected into the hemorrhoids to shrink them.  Infrared coagulation. A type of light energy is used to get rid of the hemorrhoids.  Hemorrhoidectomy surgery. The hemorrhoids are surgically removed, and the veins that supply them are tied off.  Stapled hemorrhoidopexy surgery. A circular stapling device is used to remove the hemorrhoids and use staples to cut off the blood supply to them. Follow these instructions at home: Eating and drinking   Eat foods that have a lot of fiber in them, such as whole grains, beans, nuts, fruits, and vegetables. Ask your health care provider about taking products that have added fiber (fiber supplements).  Drink enough fluid to keep your urine clear or pale yellow. Managing pain and swelling   Take warm sitz baths for 20 minutes, 3-4 times a day to ease pain and discomfort.  If directed, apply ice to the affected area. Using ice packs between sitz baths may be helpful.  Put ice in a plastic bag.  Place a towel between your skin and the bag.  Leave the ice on for 20 minutes, 2-3 times a day. General instructions   Take over-the-counter and prescription medicines only as told by your health care provider.  Use medicated creams or suppositories as told.  Exercise regularly.  Go to the bathroom when you have the urge to have a bowel movement. Do not wait.  Avoid  straining to have bowel movements.  Keep the anal area dry and clean. Use wet toilet paper or moist towelettes after a bowel movement.  Do not sit on the toilet for long periods of time. This increases blood pooling and pain. Contact a health care provider if:  You have increasing pain and swelling that are not controlled by treatment or medicine.  You have uncontrolled bleeding.  You have  difficulty having a bowel movement, or you are unable to have a bowel movement.  You have pain or inflammation outside the area of the hemorrhoids. This information is not intended to replace advice given to you by your health care provider. Make sure you discuss any questions you have with your health care provider. Document Released: 05/27/2000 Document Revised: 10/28/2015 Document Reviewed: 02/11/2015 Elsevier Interactive Patient Education  2017 Elsevier Inc. Bacterial vaginosis Vaginal Yeast infection, Adult Vaginal yeast infection is a condition that causes soreness, swelling, and redness (inflammation) of the vagina. It also causes vaginal discharge. This is a common condition. Some women get this infection frequently. What are the causes? This condition is caused by a change in the normal balance of the yeast (candida) and bacteria that live in the vagina. This change causes an overgrowth of yeast, which causes the inflammation. What increases the risk? This condition is more likely to develop in:  Women who take antibiotic medicines.  Women who have diabetes.  Women who take birth control pills.  Women who are pregnant.  Women who douche often.  Women who have a weak defense (immune) system.  Women who have been taking steroid medicines for a long time.  Women who frequently wear tight clothing. What are the signs or symptoms? Symptoms of this condition include:  White, thick vaginal discharge.  Swelling, itching, redness, and irritation of the vagina. The lips of the vagina (vulva) may be affected as well.  Pain or a burning feeling while urinating.  Pain during sex. How is this diagnosed? This condition is diagnosed with a medical history and physical exam. This will include a pelvic exam. Your health care provider will examine a sample of your vaginal discharge under a microscope. Your health care provider may send this sample for testing to confirm the  diagnosis. How is this treated? This condition is treated with medicine. Medicines may be over-the-counter or prescription. You may be told to use one or more of the following:  Medicine that is taken orally.  Medicine that is applied as a cream.  Medicine that is inserted directly into the vagina (suppository). Follow these instructions at home:  Take or apply over-the-counter and prescription medicines only as told by your health care provider.  Do not have sex until your health care provider has approved. Tell your sex partner that you have a yeast infection. That person should go to his or her health care provider if he or she develops symptoms.  Do not wear tight clothes, such as pantyhose or tight pants.  Avoid using tampons until your health care provider approves.  Eat more yogurt. This may help to keep your yeast infection from returning.  Try taking a sitz bath to help with discomfort. This is a warm water bath that is taken while you are sitting down. The water should only come up to your hips and should cover your buttocks. Do this 3-4 times per day or as told by your health care provider.  Do not douche.  Wear breathable, cotton underwear.  If you have diabetes,  keep your blood sugar levels under control. Contact a health care provider if:  You have a fever.  Your symptoms go away and then return.  Your symptoms do not get better with treatment.  Your symptoms get worse.  You have new symptoms.  You develop blisters in or around your vagina.  You have blood coming from your vagina and it is not your menstrual period.  You develop pain in your abdomen. This information is not intended to replace advice given to you by your health care provider. Make sure you discuss any questions you have with your health care provider. Document Released: 03/09/2005 Document Revised: 11/11/2015 Document Reviewed: 12/01/2014 Elsevier Interactive Patient Education  2017  Elsevier Inc.  Bacterial Vaginosis Bacterial vaginosis is a vaginal infection that occurs when the normal balance of bacteria in the vagina is disrupted. It results from an overgrowth of certain bacteria. This is the most common vaginal infection among women ages 56-44. Because bacterial vaginosis increases your risk for STIs (sexually transmitted infections), getting treated can help reduce your risk for chlamydia, gonorrhea, herpes, and HIV (human immunodeficiency virus). Treatment is also important for preventing complications in pregnant women, because this condition can cause an early (premature) delivery. What are the causes? This condition is caused by an increase in harmful bacteria that are normally present in small amounts in the vagina. However, the reason that the condition develops is not fully understood. What increases the risk? The following factors may make you more likely to develop this condition:  Having a new sexual partner or multiple sexual partners.  Having unprotected sex.  Douching.  Having an intrauterine device (IUD).  Smoking.  Drug and alcohol abuse.  Taking certain antibiotic medicines.  Being pregnant. You cannot get bacterial vaginosis from toilet seats, bedding, swimming pools, or contact with objects around you. What are the signs or symptoms? Symptoms of this condition include:  Grey or white vaginal discharge. The discharge can also be watery or foamy.  A fish-like odor with discharge, especially after sexual intercourse or during menstruation.  Itching in and around the vagina.  Burning or pain with urination. Some women with bacterial vaginosis have no signs or symptoms. How is this diagnosed? This condition is diagnosed based on:  Your medical history.  A physical exam of the vagina.  Testing a sample of vaginal fluid under a microscope to look for a large amount of bad bacteria or abnormal cells. Your health care provider may use a  cotton swab or a small wooden spatula to collect the sample. How is this treated? This condition is treated with antibiotics. These may be given as a pill, a vaginal cream, or a medicine that is put into the vagina (suppository). If the condition comes back after treatment, a second round of antibiotics may be needed. Follow these instructions at home: Medicines   Take over-the-counter and prescription medicines only as told by your health care provider.  Take or use your antibiotic as told by your health care provider. Do not stop taking or using the antibiotic even if you start to feel better. General instructions   If you have a female sexual partner, tell her that you have a vaginal infection. She should see her health care provider and be treated if she has symptoms. If you have a female sexual partner, he does not need treatment.  During treatment:  Avoid sexual activity until you finish treatment.  Do not douche.  Avoid alcohol as directed by your health  care provider.  Avoid breastfeeding as directed by your health care provider.  Drink enough water and fluids to keep your urine clear or pale yellow.  Keep the area around your vagina and rectum clean.  Wash the area daily with warm water.  Wipe yourself from front to back after using the toilet.  Keep all follow-up visits as told by your health care provider. This is important. How is this prevented?  Do not douche.  Wash the outside of your vagina with warm water only.  Use protection when having sex. This includes latex condoms and dental dams.  Limit how many sexual partners you have. To help prevent bacterial vaginosis, it is best to have sex with just one partner (monogamous).  Make sure you and your sexual partner are tested for STIs.  Wear cotton or cotton-lined underwear.  Avoid wearing tight pants and pantyhose, especially during summer.  Limit the amount of alcohol that you drink.  Do not use any  products that contain nicotine or tobacco, such as cigarettes and e-cigarettes. If you need help quitting, ask your health care provider.  Do not use illegal drugs. Where to find more information:  Centers for Disease Control and Prevention: AppraiserFraud.fi  American Sexual Health Association (ASHA): www.ashastd.org  [place] Department of Health and Financial controller, Office on Women's Health: DustingSprays.pl or SecuritiesCard.it Contact a health care provider if:  Your symptoms do not improve, even after treatment.  You have more discharge or pain when urinating.  You have a fever.  You have pain in your abdomen.  You have pain during sex.  You have vaginal bleeding between periods. Summary  Bacterial vaginosis is a vaginal infection that occurs when the normal balance of bacteria in the vagina is disrupted.  Because bacterial vaginosis increases your risk for STIs (sexually transmitted infections), getting treated can help reduce your risk for chlamydia, gonorrhea, herpes, and HIV (human immunodeficiency virus). Treatment is also important for preventing complications in pregnant women, because the condition can cause an early (premature) delivery.  This condition is treated with antibiotic medicines. These may be given as a pill, a vaginal cream, or a medicine that is put into the vagina (suppository). This information is not intended to replace advice given to you by your health care provider. Make sure you discuss any questions you have with your health care provider. Document Released: 05/30/2005 Document Revised: 02/13/2016 Document Reviewed: 02/13/2016 Elsevier Interactive Patient Education  2017 [employer].

## 2016-10-14 LAB — URINE CULTURE: Organism ID, Bacteria: NO GROWTH

## 2016-10-26 ENCOUNTER — Encounter: Payer: Self-pay | Admitting: Gynecology

## 2017-01-04 ENCOUNTER — Encounter: Payer: Self-pay | Admitting: Gynecology

## 2017-01-04 ENCOUNTER — Ambulatory Visit (INDEPENDENT_AMBULATORY_CARE_PROVIDER_SITE_OTHER): Payer: BLUE CROSS/BLUE SHIELD | Admitting: Gynecology

## 2017-01-04 VITALS — BP 140/82 | Ht 66.0 in | Wt 210.0 lb

## 2017-01-04 DIAGNOSIS — M8580 Other specified disorders of bone density and structure, unspecified site: Secondary | ICD-10-CM | POA: Diagnosis not present

## 2017-01-04 DIAGNOSIS — Z01419 Encounter for gynecological examination (general) (routine) without abnormal findings: Secondary | ICD-10-CM

## 2017-01-04 MED ORDER — ESTRADIOL 10 MCG VA TABS: 1.0000 | ORAL_TABLET | VAGINAL | 11 refills | Status: DC

## 2017-01-04 NOTE — Patient Instructions (Signed)
Estradiol vaginal tablets What is this medicine? ESTRADIOL (es tra DYE ole) vaginal tablet is used to help relieve symptoms of vaginal irritation and dryness that occurs in some women during menopause. This medicine may be used for other purposes; ask your health care provider or pharmacist if you have questions. COMMON BRAND NAME(S): Vagifem, Yuvafem What should I tell my health care provider before I take this medicine? They need to know if you have any of these conditions: -abnormal vaginal bleeding -blood vessel disease or blood clots -breast, cervical, endometrial, ovarian, liver, or uterine cancer -dementia -diabetes -gallbladder disease -heart disease or recent heart attack -high blood pressure -high cholesterol -high level of calcium in the blood -hysterectomy -kidney disease -liver disease -migraine headaches -protein C deficiency -protein S deficiency -stroke -systemic lupus erythematosus (SLE) -tobacco smoker -an unusual or allergic reaction to estrogens, other hormones, medicines, foods, dyes, or preservatives -pregnant or trying to get pregnant -breast-feeding How should I use this medicine? This medicine is only for use in the vagina. Do not take by mouth. Wash and dry your hands before and after use. Read package directions carefully. Unwrap the applicator package. Be sure to use a new applicator for each dose. Use at the same time each day. If the tablet has fallen out of the applicator, but is still in the package, carefully place it back into the applicator. If the tablet has fallen out of the package, that applicator should be thrown out and you should use a new applicator containing a new tablet. Lie on your back, part and bend your knees. Gently insert the applicator as far as comfortably possible into the vagina. Then, gently press the plunger until the plunger is fully depressed. This will release the tablet into the vagina. Gently remove the applicator. Throw  away the applicator after use. Do not use your medicine more often than directed. Do not stop using except on the advice of your doctor or health care professional. Talk to your pediatrician regarding the use of this medicine in children. This medicine is not approved for use in children. A patient package insert for the product will be given with each prescription and refill. Read this sheet carefully each time. The sheet may change frequently. Overdosage: If you think you have taken too much of this medicine contact a poison control center or emergency room at once. NOTE: This medicine is only for you. Do not share this medicine with others. What if I miss a dose? If you miss a dose, take it as soon as you can. If it is almost time for your next dose, take only that dose. Do not take double or extra doses. What may interact with this medicine? Do not take this medicine with any of the following medications: -aromatase inhibitors like aminoglutethimide, anastrozole, exemestane, letrozole, testolactone This medicine may also interact with the following medications: -antibiotics used to treat tuberculosis like rifabutin, rifampin and rifapentene -raloxifene or tamoxifen -warfarin This list may not describe all possible interactions. Give your health care provider a list of all the medicines, herbs, non-prescription drugs, or dietary supplements you use. Also tell them if you smoke, drink alcohol, or use illegal drugs. Some items may interact with your medicine. What should I watch for while using this medicine? Visit your health care professional for regular checks on your progress. You will need a regular breast and pelvic exam. You should also discuss the need for regular mammograms with your health care professional, and follow his or her  guidelines. This medicine can make your body retain fluid, making your fingers, hands, or ankles swell. Your blood pressure can go up. Contact your doctor or  health care professional if you feel you are retaining fluid. If you have any reason to think you are pregnant; stop taking this medicine at once and contact your doctor or health care professional. Tobacco smoking increases the risk of getting a blood clot or having a stroke, especially if you are more than 61 years old. You are strongly advised not to smoke. If you wear contact lenses and notice visual changes, or if the lenses begin to feel uncomfortable, consult your eye care specialist. If you are going to have elective surgery, you may need to stop taking this medicine beforehand. Consult your health care professional for advice prior to scheduling the surgery. What side effects may I notice from receiving this medicine? Side effects that you should report to your doctor or health care professional as soon as possible: -allergic reactions like skin rash, itching or hives, swelling of the face, lips, or tongue -breast tissue changes or discharge -changes in vision -chest pain -confusion, trouble speaking or understanding -dark urine -general ill feeling or flu-like symptoms -light-colored stools -nausea, vomiting -pain, swelling, warmth in the leg -right upper belly pain -severe headaches -shortness of breath -sudden numbness or weakness of the face, arm or leg -trouble walking, dizziness, loss of balance or coordination -unusual vaginal bleeding -yellowing of the eyes or skin Side effects that usually do not require medical attention (report to your doctor or health care professional if they continue or are bothersome): -hair loss -increased hunger or thirst -increased urination -symptoms of vaginal infection like itching, irritation or unusual discharge -unusually weak or tired This list may not describe all possible side effects. Call your doctor for medical advice about side effects. You may report side effects to FDA at 1-800-FDA-1088. Where should I keep my medicine? Keep  out of the reach of children. Store at room temperature between 15 and 30 degrees C (59 and 86 degrees F). Throw away any unused medicine after the expiration date. NOTE: This sheet is a summary. It may not cover all possible information. If you have questions about this medicine, talk to your doctor, pharmacist, or health care provider.  2018 Elsevier/Gold Standard (2014-05-14 09:22:51)  

## 2017-01-04 NOTE — Addendum Note (Signed)
Addended by: Burnett Kanaris on: 01/04/2017 09:43 AM   Modules accepted: Orders

## 2017-01-04 NOTE — Progress Notes (Signed)
Tanya Gomez 05-25-1956 [id number]   History:    61 y.o.  for annual gyn exam with no complaints today.. Patient is menopausal on no hormone replacement therapy. Patient with no previous history of any abnormal Pap smear.Her primary physician is Dr. Silvio Pate who has been treating her for her hyperlipidemia. Patient with normal colonoscopy in 2016 she is on a 10 year recall. Patient's last bone density study was in 2017 lowest T score was -1.2 at the right femoral neck with a normal Frax analysis. Patient's mammogram this year had demonstrated some calcifications on the left breast and patient was instructed to return back in 6 months she scheduled to do so this August. Patient has not palpated any masses during her own self breast exams.  Past medical history,surgical history, family history and social history were all reviewed and documented in the EPIC chart.  Gynecologic History No LMP recorded. Patient is postmenopausal. Contraception: post menopausal status Last Pap: 2015. Results were: normal Last mammogram: See above. Results were: See above  Obstetric History OB History  Gravida Para Term Preterm AB Living  2 2       2   SAB TAB Ectopic Multiple Live Births               # Outcome Date GA Lbr Len/2nd Weight Sex Delivery Anes PTL Lv  2 Para      Vag-Spont     1 Para      Vag-Spont          ROS: A ROS was performed and pertinent positives and negatives are included in the history.  GENERAL: No fevers or chills. HEENT: No change in vision, no earache, sore throat or sinus congestion. NECK: No pain or stiffness. CARDIOVASCULAR: No chest pain or pressure. No palpitations. PULMONARY: No shortness of breath, cough or wheeze. GASTROINTESTINAL: No abdominal pain, nausea, vomiting or diarrhea, melena or bright red blood per rectum. GENITOURINARY: No urinary frequency, urgency, hesitancy or dysuria. MUSCULOSKELETAL: No joint or muscle pain, no back pain, no recent trauma. DERMATOLOGIC:  No rash, no itching, no lesions. ENDOCRINE: No polyuria, polydipsia, no heat or cold intolerance. No recent change in weight. HEMATOLOGICAL: No anemia or easy bruising or bleeding. NEUROLOGIC: No headache, seizures, numbness, tingling or weakness. PSYCHIATRIC: No depression, no loss of interest in normal activity or change in sleep pattern.     Exam: chaperone present  BP 140/82   Ht 5\' 6"  (1.676 m)   Wt 210 lb (95.3 kg)   BMI 33.89 kg/m   Body mass index is 33.89 kg/m.  General appearance : Well developed well nourished female. No acute distress HEENT: Eyes: no retinal hemorrhage or exudates,  Neck supple, trachea midline, no carotid bruits, no thyroidmegaly Lungs: Clear to auscultation, no rhonchi or wheezes, or rib retractions  Heart: Regular rate and rhythm, no murmurs or gallops Breast:Examined in sitting and supine position were symmetrical in appearance, no palpable masses or tenderness,  no skin retraction, no nipple inversion, no nipple discharge, no skin discoloration, no axillary or supraclavicular lymphadenopathy Abdomen: no palpable masses or tenderness, no rebound or guarding Extremities: no edema or skin discoloration or tenderness  Pelvic:  Bartholin, Urethra, Skene Glands: Within normal limits             Vagina: No gross lesions or discharge, atrophic changes friable on contact  Cervix: No gross lesions or discharge  Uterus  anteverted, normal size, shape and consistency, non-tender and mobile  Adnexa  Without masses  or tenderness  Anus and perineum  normal   Rectovaginal  normal sphincter tone without palpated masses or tenderness             Hemoccult cards will be provided     Assessment/Plan:  61 y.o. female for annual exam postmenopausal on no hormone replacement therapy during pelvic exam severe vaginal atrophy was noted tissue friable on contact. I've recommended ultra low-dose vaginal tablet such as Vagifem 10 g twice a week to help. The risks benefits  and pros and cons were discussed. Literature information was provided. Pap smear was done today. Fecal Hemoccult cards were provided for the patient to submit to the office for testing. Patient will need a bone density study next year. Patient scheduled for follow-up mammogram on left breast calcification scheduled for this August. She was reminded do her monthly breast exam. We discussed importance of calcium and vitamin D and weightbearing exercises for osteoporosis prevention. PCP has been doing her blood work.   Terrance Mass MD, 9:37 AM 01/04/2017

## 2017-01-06 LAB — PAP, TP IMAGING W/ HPV RNA, RFLX HPV TYPE 16,18/45: HPV mRNA, High Risk: NOT DETECTED

## 2017-01-17 ENCOUNTER — Encounter: Payer: Self-pay | Admitting: Obstetrics & Gynecology

## 2017-05-05 ENCOUNTER — Other Ambulatory Visit: Payer: Self-pay | Admitting: Internal Medicine

## 2017-05-10 ENCOUNTER — Ambulatory Visit (INDEPENDENT_AMBULATORY_CARE_PROVIDER_SITE_OTHER): Payer: BLUE CROSS/BLUE SHIELD | Admitting: Internal Medicine

## 2017-05-10 ENCOUNTER — Encounter: Payer: Self-pay | Admitting: Internal Medicine

## 2017-05-10 VITALS — BP 140/78 | HR 80 | Temp 97.9°F | Ht 66.5 in | Wt 214.5 lb

## 2017-05-10 DIAGNOSIS — I10 Essential (primary) hypertension: Secondary | ICD-10-CM

## 2017-05-10 DIAGNOSIS — Z0000 Encounter for general adult medical examination without abnormal findings: Secondary | ICD-10-CM | POA: Diagnosis not present

## 2017-05-10 DIAGNOSIS — E785 Hyperlipidemia, unspecified: Secondary | ICD-10-CM

## 2017-05-10 DIAGNOSIS — Z23 Encounter for immunization: Secondary | ICD-10-CM | POA: Diagnosis not present

## 2017-05-10 LAB — COMPREHENSIVE METABOLIC PANEL
ALBUMIN: 4.4 g/dL (ref 3.5–5.2)
ALK PHOS: 74 U/L (ref 39–117)
ALT: 18 U/L (ref 0–35)
AST: 17 U/L (ref 0–37)
BILIRUBIN TOTAL: 0.5 mg/dL (ref 0.2–1.2)
BUN: 18 mg/dL (ref 6–23)
CALCIUM: 9.8 mg/dL (ref 8.4–10.5)
CO2: 31 mEq/L (ref 19–32)
Chloride: 102 mEq/L (ref 96–112)
Creatinine, Ser: 0.75 mg/dL (ref 0.40–1.20)
GFR: 83.33 mL/min (ref >60.00)
GLUCOSE: 99 mg/dL (ref 70–99)
Potassium: 4.1 mEq/L (ref 3.5–5.1)
Sodium: 139 mEq/L (ref 135–145)
TOTAL PROTEIN: 7.6 g/dL (ref 6.0–8.3)

## 2017-05-10 LAB — CBC
HEMATOCRIT: 44.1 % (ref 36.0–46.0)
HEMOGLOBIN: 15.2 g/dL — AB (ref 12.0–15.0)
MCHC: 34.4 g/dL (ref 30.0–36.0)
MCV: 93.7 fl (ref 78.0–100.0)
Platelets: 277 10*3/uL (ref 150.0–400.0)
RBC: 4.7 Mil/uL (ref 3.87–5.11)
RDW: 12.3 % (ref 11.5–15.5)
WBC: 8.6 10*3/uL (ref 4.0–10.5)

## 2017-05-10 LAB — LIPID PANEL
CHOLESTEROL: 168 mg/dL (ref 0–200)
HDL: 60.6 mg/dL (ref >39.00)
LDL Cholesterol: 83 mg/dL (ref 0–99)
NONHDL: 107.6
Total CHOL/HDL Ratio: 3
Triglycerides: 124 mg/dL (ref 0.0–149.0)
VLDL: 24.8 mg/dL (ref 0.0–40.0)

## 2017-05-10 NOTE — Patient Instructions (Signed)
DASH Eating Plan DASH stands for "Dietary Approaches to Stop Hypertension." The DASH eating plan is a healthy eating plan that has been shown to reduce high blood pressure (hypertension). It may also reduce your risk for type 2 diabetes, heart disease, and stroke. The DASH eating plan may also help with weight loss. What are tips for following this plan? General guidelines  Avoid eating more than 2,300 mg (milligrams) of salt (sodium) a day. If you have hypertension, you may need to reduce your sodium intake to 1,500 mg a day.  Limit alcohol intake to no more than 1 drink a day for nonpregnant women and 2 drinks a day for men. One drink equals 12 oz of beer, 5 oz of wine, or 1 oz of hard liquor.  Work with your health care provider to maintain a healthy body weight or to lose weight. Ask what an ideal weight is for you.  Get at least 30 minutes of exercise that causes your heart to beat faster (aerobic exercise) most days of the week. Activities may include walking, swimming, or biking.  Work with your health care provider or diet and nutrition specialist (dietitian) to adjust your eating plan to your individual calorie needs. Reading food labels  Check food labels for the amount of sodium per serving. Choose foods with less than 5 percent of the Daily Value of sodium. Generally, foods with less than 300 mg of sodium per serving fit into this eating plan.  To find whole grains, look for the word "whole" as the first word in the ingredient list. Shopping  Buy products labeled as "low-sodium" or "no salt added."  Buy fresh foods. Avoid canned foods and premade or frozen meals. Cooking  Avoid adding salt when cooking. Use salt-free seasonings or herbs instead of table salt or sea salt. Check with your health care provider or pharmacist before using salt substitutes.  Do not fry foods. Cook foods using healthy methods such as baking, boiling, grilling, and broiling instead.  Cook with  heart-healthy oils, such as olive, canola, soybean, or sunflower oil. Meal planning   Eat a balanced diet that includes: ? 5 or more servings of fruits and vegetables each day. At each meal, try to fill half of your plate with fruits and vegetables. ? Up to 6-8 servings of whole grains each day. ? Less than 6 oz of lean meat, poultry, or fish each day. A 3-oz serving of meat is about the same size as a deck of cards. One egg equals 1 oz. ? 2 servings of low-fat dairy each day. ? A serving of nuts, seeds, or beans 5 times each week. ? Heart-healthy fats. Healthy fats called Omega-3 fatty acids are found in foods such as flaxseeds and coldwater fish, like sardines, salmon, and mackerel.  Limit how much you eat of the following: ? Canned or prepackaged foods. ? Food that is high in trans fat, such as fried foods. ? Food that is high in saturated fat, such as fatty meat. ? Sweets, desserts, sugary drinks, and other foods with added sugar. ? Full-fat dairy products.  Do not salt foods before eating.  Try to eat at least 2 vegetarian meals each week.  Eat more home-cooked food and less restaurant, buffet, and fast food.  When eating at a restaurant, ask that your food be prepared with less salt or no salt, if possible. What foods are recommended? The items listed may not be a complete list. Talk with your dietitian about what   dietary choices are best for you. Grains Whole-grain or whole-wheat bread. Whole-grain or whole-wheat pasta. Brown rice. Oatmeal. Quinoa. Bulgur. Whole-grain and low-sodium cereals. Pita bread. Low-fat, low-sodium crackers. Whole-wheat flour tortillas. Vegetables Fresh or frozen vegetables (raw, steamed, roasted, or grilled). Low-sodium or reduced-sodium tomato and vegetable juice. Low-sodium or reduced-sodium tomato sauce and tomato paste. Low-sodium or reduced-sodium canned vegetables. Fruits All fresh, dried, or frozen fruit. Canned fruit in natural juice (without  added sugar). Meat and other protein foods Skinless chicken or turkey. Ground chicken or turkey. Pork with fat trimmed off. Fish and seafood. Egg whites. Dried beans, peas, or lentils. Unsalted nuts, nut butters, and seeds. Unsalted canned beans. Lean cuts of beef with fat trimmed off. Low-sodium, lean deli meat. Dairy Low-fat (1%) or fat-free (skim) milk. Fat-free, low-fat, or reduced-fat cheeses. Nonfat, low-sodium ricotta or cottage cheese. Low-fat or nonfat yogurt. Low-fat, low-sodium cheese. Fats and oils Soft margarine without trans fats. Vegetable oil. Low-fat, reduced-fat, or light mayonnaise and salad dressings (reduced-sodium). Canola, safflower, olive, soybean, and sunflower oils. Avocado. Seasoning and other foods Herbs. Spices. Seasoning mixes without salt. Unsalted popcorn and pretzels. Fat-free sweets. What foods are not recommended? The items listed may not be a complete list. Talk with your dietitian about what dietary choices are best for you. Grains Baked goods made with fat, such as croissants, muffins, or some breads. Dry pasta or rice meal packs. Vegetables Creamed or fried vegetables. Vegetables in a cheese sauce. Regular canned vegetables (not low-sodium or reduced-sodium). Regular canned tomato sauce and paste (not low-sodium or reduced-sodium). Regular tomato and vegetable juice (not low-sodium or reduced-sodium). Pickles. Olives. Fruits Canned fruit in a light or heavy syrup. Fried fruit. Fruit in cream or butter sauce. Meat and other protein foods Fatty cuts of meat. Ribs. Fried meat. Bacon. Sausage. Bologna and other processed lunch meats. Salami. Fatback. Hotdogs. Bratwurst. Salted nuts and seeds. Canned beans with added salt. Canned or smoked fish. Whole eggs or egg yolks. Chicken or turkey with skin. Dairy Whole or 2% milk, cream, and half-and-half. Whole or full-fat cream cheese. Whole-fat or sweetened yogurt. Full-fat cheese. Nondairy creamers. Whipped toppings.  Processed cheese and cheese spreads. Fats and oils Butter. Stick margarine. Lard. Shortening. Ghee. Bacon fat. Tropical oils, such as coconut, palm kernel, or palm oil. Seasoning and other foods Salted popcorn and pretzels. Onion salt, garlic salt, seasoned salt, table salt, and sea salt. Worcestershire sauce. Tartar sauce. Barbecue sauce. Teriyaki sauce. Soy sauce, including reduced-sodium. Steak sauce. Canned and packaged gravies. Fish sauce. Oyster sauce. Cocktail sauce. Horseradish that you find on the shelf. Ketchup. Mustard. Meat flavorings and tenderizers. Bouillon cubes. Hot sauce and Tabasco sauce. Premade or packaged marinades. Premade or packaged taco seasonings. Relishes. Regular salad dressings. Where to find more information:  National Heart, Lung, and Blood Institute: www.nhlbi.nih.gov  American Heart Association: www.heart.org Summary  The DASH eating plan is a healthy eating plan that has been shown to reduce high blood pressure (hypertension). It may also reduce your risk for type 2 diabetes, heart disease, and stroke.  With the DASH eating plan, you should limit salt (sodium) intake to 2,300 mg a day. If you have hypertension, you may need to reduce your sodium intake to 1,500 mg a day.  When on the DASH eating plan, aim to eat more fresh fruits and vegetables, whole grains, lean proteins, low-fat dairy, and heart-healthy fats.  Work with your health care provider or diet and nutrition specialist (dietitian) to adjust your eating plan to your individual   calorie needs. This information is not intended to replace advice given to you by your health care provider. Make sure you discuss any questions you have with your health care provider. Document Released: 05/19/2011 Document Revised: 05/23/2016 Document Reviewed: 05/23/2016 Elsevier Interactive Patient Education  2017 [employer] Inc.  

## 2017-05-10 NOTE — Assessment & Plan Note (Signed)
No problems with primary prevention 

## 2017-05-10 NOTE — Progress Notes (Signed)
Subjective:    Patient ID: Tanya Gomez, female    DOB: Jul 09, 1955, 61 y.o.   MRN: [id number]  HPI Here for physical  No new concerns Did see gyn this year  No recent exercise--other than chasing grandkids Discussed lifestyle  No problems with meds  Current Outpatient Medications on File Prior to Visit  Medication Sig Dispense Refill  . atorvastatin (LIPITOR) 20 MG tablet TAKE 1 TABLET BY MOUTH DAILY. 90 tablet 3  . hydrochlorothiazide (MICROZIDE) 12.5 MG capsule TAKE 1 CAPSULE BY MOUTH EVERY MORNING 90 capsule 3  . Multiple Vitamin (MULTIVITAMIN) capsule Take 1 capsule by mouth daily.     No current facility-administered medications on file prior to visit.     No Known Allergies  Past Medical History:  Diagnosis Date  . Hx of colonic polyps    adenomatous  . Hyperlipidemia   . Hypertension   . Osteoarthritis    no per pt 06-17-14  . Osteopenia     Past Surgical History:  Procedure Laterality Date  . COLONOSCOPY    . PATELLAR TENDON REPAIR  1999   left, spurs removed  . TUBAL LIGATION    . VAGINAL DELIVERY     x2    Family History  Problem Relation Age of Onset  . Diabetes Mother   . Stroke Mother   . Peripheral vascular disease Mother   . Coronary artery disease Father   . Diabetes Father   . Heart disease Father   . Brain cancer Maternal Grandmother   . Cancer Maternal Grandmother        brain  . Hypertension Neg Hx   . Hearing loss Neg Hx   . Colon cancer Neg Hx   . Esophageal cancer Neg Hx   . Stomach cancer Neg Hx   . Rectal cancer Neg Hx     Social History   Socioeconomic History  . Marital status: Divorced    Spouse name: Not on file  . Number of children: 2  . Years of education: Not on file  . Highest education level: Not on file  Social Needs  . Financial resource strain: Not on file  . Food insecurity - worry: Not on file  . Food insecurity - inability: Not on file  . Transportation needs - medical: Not on file  .  Transportation needs - non-medical: Not on file  Occupational History  . Occupation: [email or web address]  Tobacco Use  . Smoking status: Never Smoker  . Smokeless tobacco: Never Used  Substance and Sexual Activity  . Alcohol use: Yes    Alcohol/week: 0.0 oz    Comment: occasional  . Drug use: No  . Sexual activity: Not Currently  Other Topics Concern  . Not on file  Social History Narrative  . Not on file   Review of Systems  Constitutional: Negative for fatigue and unexpected weight change.       Wears seat belt  HENT: Positive for hearing loss and tinnitus. Negative for dental problem.        Same right ear problems--- hearing not great  Eyes: Negative for visual disturbance.       No diplopia or unilateral vision loss  Respiratory: Negative for cough, chest tightness and shortness of breath.   Cardiovascular: Negative for chest pain, palpitations and leg swelling.  Gastrointestinal: Negative for blood in stool, constipation and nausea.       No heartburn  Endocrine: Negative for polydipsia and polyuria.  Genitourinary:  Negative for difficulty urinating, dyspareunia, dysuria and frequency.  Musculoskeletal: Negative for arthralgias, back pain and joint swelling.       Some stiffness after sitting a while--no sig pain  Skin: Negative for rash.       No suspicious lesions  Allergic/Immunologic: Negative for environmental allergies and immunocompromised state.  Neurological: Negative for dizziness, syncope, light-headedness and headaches.  Hematological: Negative for adenopathy. Does not bruise/bleed easily.  Psychiatric/Behavioral: Negative for dysphoric mood and sleep disturbance. The patient is not nervous/anxious.        Objective:   Physical Exam  Constitutional: She is oriented to person, place, and time. She appears well-developed. No distress.  HENT:  Head: Normocephalic and atraumatic.  Right Ear: External ear normal.  Left Ear: External ear  normal.  Mouth/Throat: Oropharynx is clear and moist. No oropharyngeal exudate.  Eyes: Conjunctivae are normal. Pupils are equal, round, and reactive to light.  Neck: Normal range of motion. No thyromegaly present.  Cardiovascular: Normal rate, regular rhythm, normal heart sounds and intact distal pulses. Exam reveals no gallop.  No murmur heard. Pulmonary/Chest: Effort normal and breath sounds normal. No respiratory distress. She has no wheezes. She has no rales.  Abdominal: Soft. She exhibits no distension. There is no tenderness. There is no rebound and no guarding.  Musculoskeletal: She exhibits no edema or tenderness.  Lymphadenopathy:    She has no cervical adenopathy.  Neurological: She is alert and oriented to person, place, and time.  Skin: No rash noted. No erythema.  Psychiatric: She has a normal mood and affect. Her behavior is normal.          Assessment & Plan:

## 2017-05-10 NOTE — Assessment & Plan Note (Signed)
Healthy but needs to work on fitness DASH info Colon due 2021 Flu vaccine today Just had pap and mammo

## 2017-05-10 NOTE — Assessment & Plan Note (Signed)
BP Readings from Last 3 Encounters:  05/10/17 140/78  01/04/17 140/82  10/13/16 (!) 142/80   Reasonable control No change Check labs

## 2017-05-15 ENCOUNTER — Other Ambulatory Visit: Payer: Self-pay | Admitting: Internal Medicine

## 2017-07-26 ENCOUNTER — Ambulatory Visit (INDEPENDENT_AMBULATORY_CARE_PROVIDER_SITE_OTHER): Payer: BLUE CROSS/BLUE SHIELD | Admitting: Internal Medicine

## 2017-07-26 ENCOUNTER — Encounter: Payer: Self-pay | Admitting: Internal Medicine

## 2017-07-26 VITALS — BP 134/88 | HR 90 | Temp 98.0°F | Wt 215.0 lb

## 2017-07-26 DIAGNOSIS — S86911A Strain of unspecified muscle(s) and tendon(s) at lower leg level, right leg, initial encounter: Secondary | ICD-10-CM

## 2017-07-26 NOTE — Progress Notes (Signed)
Subjective:    Patient ID: Tanya Gomez, female    DOB: 04/25/1956, 62 y.o.   MRN: [id number]  HPI Here due to right knee pain  After the snow storm, was walking the dog---he went one way, she twisted the other [place] down Then twisted again but didn't fall  Didn't seem so bad at first Then increasing soreness a couple of days later Swelling then--better with ice Will hurt now--especially after sitting for a while  Tried aleve-- 2 once a day Some pain in bed--better if she props it No instability  She wouldn't have come if children didn't insist  Current Outpatient Medications on File Prior to Visit  Medication Sig Dispense Refill  . atorvastatin (LIPITOR) 20 MG tablet TAKE 1 TABLET BY MOUTH DAILY. 90 tablet 3  . hydrochlorothiazide (MICROZIDE) 12.5 MG capsule TAKE 1 CAPSULE BY MOUTH EVERY MORNING 90 capsule 3  . Multiple Vitamin (MULTIVITAMIN) capsule Take 1 capsule by mouth daily.     No current facility-administered medications on file prior to visit.     No Known Allergies  Past Medical History:  Diagnosis Date  . Hx of colonic polyps    adenomatous  . Hyperlipidemia   . Hypertension   . Osteoarthritis    no per pt 06-17-14  . Osteopenia     Past Surgical History:  Procedure Laterality Date  . COLONOSCOPY    . PATELLAR TENDON REPAIR  1999   left, spurs removed  . TUBAL LIGATION    . VAGINAL DELIVERY     x2    Family History  Problem Relation Age of Onset  . Diabetes Mother   . Stroke Mother   . Peripheral vascular disease Mother   . Coronary artery disease Father   . Diabetes Father   . Heart disease Father   . Brain cancer Maternal Grandmother   . Cancer Maternal Grandmother        brain  . Hypertension Neg Hx   . Hearing loss Neg Hx   . Colon cancer Neg Hx   . Esophageal cancer Neg Hx   . Stomach cancer Neg Hx   . Rectal cancer Neg Hx     Social History   Socioeconomic History  . Marital status: Divorced    Spouse name: Not on file    . Number of children: 2  . Years of education: Not on file  . Highest education level: Not on file  Social Needs  . Financial resource strain: Not on file  . Food insecurity - worry: Not on file  . Food insecurity - inability: Not on file  . Transportation needs - medical: Not on file  . Transportation needs - non-medical: Not on file  Occupational History  . Occupation: [email or web address]  Tobacco Use  . Smoking status: Never Smoker  . Smokeless tobacco: Never Used  Substance and Sexual Activity  . Alcohol use: Yes    Alcohol/week: 0.0 oz    Comment: occasional  . Drug use: No  . Sexual activity: Not Currently  Other Topics Concern  . Not on file  Social History Narrative  . Not on file   Review of Systems No fever Not sick No redness    Objective:   Physical Exam  Musculoskeletal:  Right knee--at most slight effusion No inflammation or tenderness ROM normal--slight pain with full flexion No ligament or meniscus findings  Neurological:  Mildly antalgic gait with slight varus angulation of right knee (then holds straight)  No weakness          Assessment & Plan:

## 2017-07-26 NOTE — Assessment & Plan Note (Signed)
Twisting injury but no evidence of meniscus tear or ligament damage Okay to start in gym--moderate exercise to avoid pain Discussed quad strengthening Supportive care

## 2017-09-05 ENCOUNTER — Other Ambulatory Visit: Payer: Self-pay | Admitting: Otolaryngology

## 2017-09-05 DIAGNOSIS — H903 Sensorineural hearing loss, bilateral: Secondary | ICD-10-CM

## 2017-09-05 DIAGNOSIS — H9311 Tinnitus, right ear: Secondary | ICD-10-CM

## 2017-09-20 ENCOUNTER — Ambulatory Visit
Admission: RE | Admit: 2017-09-20 | Discharge: 2017-09-20 | Disposition: A | Discharge status: Home / Self Care | Payer: BLUE CROSS/BLUE SHIELD | Source: Ambulatory Visit | Attending: Otolaryngology | Admitting: Otolaryngology

## 2017-09-20 DIAGNOSIS — H9311 Tinnitus, right ear: Secondary | ICD-10-CM

## 2017-09-20 DIAGNOSIS — H903 Sensorineural hearing loss, bilateral: Secondary | ICD-10-CM

## 2017-09-20 MED ORDER — GADOBENATE DIMEGLUMINE 529 MG/ML IV SOLN
20.0000 mL | Freq: Once | INTRAVENOUS | Status: AC | PRN
  Administered 2017-09-20: 20 mL via INTRAVENOUS

## 2018-01-05 ENCOUNTER — Ambulatory Visit (INDEPENDENT_AMBULATORY_CARE_PROVIDER_SITE_OTHER): Payer: Managed Care, Other (non HMO) | Admitting: Obstetrics & Gynecology

## 2018-01-05 ENCOUNTER — Encounter: Payer: Self-pay | Admitting: Obstetrics & Gynecology

## 2018-01-05 VITALS — BP 154/96 | Ht 66.75 in | Wt 210.0 lb

## 2018-01-05 DIAGNOSIS — Z01419 Encounter for gynecological examination (general) (routine) without abnormal findings: Secondary | ICD-10-CM | POA: Diagnosis not present

## 2018-01-05 DIAGNOSIS — Z780 Asymptomatic menopausal state: Secondary | ICD-10-CM | POA: Diagnosis not present

## 2018-01-05 DIAGNOSIS — N39490 Overflow incontinence: Secondary | ICD-10-CM

## 2018-01-05 DIAGNOSIS — M85851 Other specified disorders of bone density and structure, right thigh: Secondary | ICD-10-CM | POA: Diagnosis not present

## 2018-01-05 NOTE — Progress Notes (Signed)
Tanya Gomez 1956-04-28 [id number]   History:    62 y.o. G2P2L2 Divorced  RP:  Established patient presenting for annual gyn exam   HPI: Menopause, well on no hormone replacement therapy.  No postmenopausal bleeding.  No pelvic pain.  Abstinent.  Complains of mild overflow dribbling of urine.  No urgency or stress urinary incontinence otherwise.  Bowel movements normal.  Breasts normal.  Body mass index 33.14.  Health labs with family physician.  Past medical history,surgical history, family history and social history were all reviewed and documented in the EPIC chart.  Gynecologic History No LMP recorded. Patient is postmenopausal. Contraception: abstinence and post menopausal status Last Pap: 08/2013. Results were: Negative, HPV HR neg Last mammogram: 01/2017. Results were: Negative Bone Density: 01/2016 Osteopenia T-Score -1.2 Colonoscopy: 2016  Obstetric History OB History  Gravida Para Term Preterm AB Living  2 2       2   SAB TAB Ectopic Multiple Live Births               # Outcome Date GA Lbr Len/2nd Weight Sex Delivery Anes PTL Lv  2 Para      Vag-Spont     1 Para      Vag-Spont        ROS: A ROS was performed and pertinent positives and negatives are included in the history.  GENERAL: No fevers or chills. HEENT: No change in vision, no earache, sore throat or sinus congestion. NECK: No pain or stiffness. CARDIOVASCULAR: No chest pain or pressure. No palpitations. PULMONARY: No shortness of breath, cough or wheeze. GASTROINTESTINAL: No abdominal pain, nausea, vomiting or diarrhea, melena or bright red blood per rectum. GENITOURINARY: No urinary frequency, urgency, hesitancy or dysuria. MUSCULOSKELETAL: No joint or muscle pain, no back pain, no recent trauma. DERMATOLOGIC: No rash, no itching, no lesions. ENDOCRINE: No polyuria, polydipsia, no heat or cold intolerance. No recent change in weight. HEMATOLOGICAL: No anemia or easy bruising or bleeding. NEUROLOGIC: No  headache, seizures, numbness, tingling or weakness. PSYCHIATRIC: No depression, no loss of interest in normal activity or change in sleep pattern.     Exam:   BP (!) 154/96   Ht 5' 6.75" (1.695 m)   Wt 210 lb (95.3 kg)   BMI 33.14 kg/m   Body mass index is 33.14 kg/m.  General appearance : Well developed well nourished female. No acute distress HEENT: Eyes: no retinal hemorrhage or exudates,  Neck supple, trachea midline, no carotid bruits, no thyroidmegaly Lungs: Clear to auscultation, no rhonchi or wheezes, or rib retractions  Heart: Regular rate and rhythm, no murmurs or gallops Breast:Examined in sitting and supine position were symmetrical in appearance, no palpable masses or tenderness,  no skin retraction, no nipple inversion, no nipple discharge, no skin discoloration, no axillary or supraclavicular lymphadenopathy Abdomen: no palpable masses or tenderness, no rebound or guarding Extremities: no edema or skin discoloration or tenderness  Pelvic: Vulva: Normal             Vagina: No gross lesions or discharge  Cervix: No gross lesions or discharge.  Pap reflex done.  Uterus  AV, normal size, shape and consistency, non-tender and mobile  Adnexa  Without masses or tenderness  Anus: Normal   Assessment/Plan:  62 y.o. female for annual exam   1. Encounter for routine gynecological examination with Papanicolaou smear of cervix Normal gynecologic exam.  Pap reflex done.  Breast exam normal.  Will schedule screening mammogram in August 2019.  Colonoscopy  in 2016.  Body mass index 33.14.  Recommend low calorie/low carb diet such as [employer] and regular physical activity with aerobic activities 5 times a week and weight lifting every 2 days. - Pap IG w/ reflex to HPV when ASC-U  2. Menopause present Well on no hormone replacement therapy.  No postmenopausal bleeding.  3. Overflow incontinence of urine Recommend regular micturition maximum every 3 hours.  Kegel exercises  to reinforce the pelvic floor.  4. Osteopenia of neck of right femur Will schedule bone density in August 2019.  Recommend vitamin D supplements, calcium rich nutrition and regular weightbearing physical activity. - DG Bone Density; Future  Counseling on above issues and coordination of care more than 50% for 10 minutes.  Princess Bruins MD, 10:05 AM 01/05/2018

## 2018-01-06 ENCOUNTER — Encounter: Payer: Self-pay | Admitting: Obstetrics & Gynecology

## 2018-01-06 NOTE — Patient Instructions (Addendum)
1. Encounter for routine gynecological examination with Papanicolaou smear of cervix Normal gynecologic exam.  Pap reflex done.  Breast exam normal.  Will schedule screening mammogram in August 2019.  Colonoscopy in 2016.  Body mass index 33.14.  Recommend low calorie/low carb diet such as [employer] and regular physical activity with aerobic activities 5 times a week and weight lifting every 2 days. - Pap IG w/ reflex to HPV when ASC-U  2. Menopause present Well on no hormone replacement therapy.  No postmenopausal bleeding.  3. Overflow incontinence of urine Recommend regular micturition maximum every 3 hours.  Kegel exercises to reinforce the pelvic floor.  4. Osteopenia of neck of right femur Will schedule bone density in August 2019.  Recommend vitamin D supplements, calcium rich nutrition and regular weightbearing physical activity. - DG Bone Density; Future  Tanya Gomez, it was a pleasure seeing you today!  I will inform you of your results as soon as they are available.   Kegel Exercises Kegel exercises help strengthen the muscles that support the rectum, vagina, small intestine, bladder, and uterus. Doing Kegel exercises can help:  Improve bladder and bowel control.  Improve sexual response.  Reduce problems and discomfort during pregnancy.  Kegel exercises involve squeezing your pelvic floor muscles, which are the same muscles you squeeze when you try to stop the flow of urine. The exercises can be done while sitting, standing, or lying down, but it is best to vary your position. Phase 1 exercises 1. Squeeze your pelvic floor muscles tight. You should feel a tight lift in your rectal area. If you are a female, you should also feel a tightness in your vaginal area. Keep your stomach, buttocks, and legs relaxed. 2. Hold the muscles tight for up to 10 seconds. 3. Relax your muscles. Repeat this exercise 50 times a day or as many times as told by your health care provider.  Continue to do this exercise for at least 4-6 weeks or for as long as told by your health care provider. This information is not intended to replace advice given to you by your health care provider. Make sure you discuss any questions you have with your health care provider. Document Released: 05/16/2012 Document Revised: 01/23/2016 Document Reviewed: 04/19/2015 Elsevier Interactive Patient Education  [employer].

## 2018-01-08 LAB — PAP IG W/ RFLX HPV ASCU

## 2018-01-11 DIAGNOSIS — M8580 Other specified disorders of bone density and structure, unspecified site: Secondary | ICD-10-CM

## 2018-01-11 HISTORY — DX: Other specified disorders of bone density and structure, unspecified site: M85.80

## 2018-01-22 ENCOUNTER — Encounter: Payer: Self-pay | Admitting: Obstetrics & Gynecology

## 2018-01-24 ENCOUNTER — Other Ambulatory Visit: Payer: Self-pay | Admitting: Gynecology

## 2018-01-24 DIAGNOSIS — M85851 Other specified disorders of bone density and structure, right thigh: Secondary | ICD-10-CM

## 2018-02-06 ENCOUNTER — Ambulatory Visit (INDEPENDENT_AMBULATORY_CARE_PROVIDER_SITE_OTHER): Payer: Managed Care, Other (non HMO)

## 2018-02-06 ENCOUNTER — Encounter: Payer: Self-pay | Admitting: Gynecology

## 2018-02-06 ENCOUNTER — Other Ambulatory Visit: Payer: Self-pay | Admitting: Gynecology

## 2018-02-06 DIAGNOSIS — M8589 Other specified disorders of bone density and structure, multiple sites: Secondary | ICD-10-CM | POA: Diagnosis not present

## 2018-02-06 DIAGNOSIS — Z13820 Encounter for screening for osteoporosis: Secondary | ICD-10-CM

## 2018-02-06 DIAGNOSIS — M85851 Other specified disorders of bone density and structure, right thigh: Secondary | ICD-10-CM

## 2018-04-27 ENCOUNTER — Other Ambulatory Visit: Payer: Self-pay | Admitting: Internal Medicine

## 2018-05-12 ENCOUNTER — Other Ambulatory Visit: Payer: Self-pay | Admitting: Internal Medicine

## 2018-05-15 ENCOUNTER — Encounter: Payer: Self-pay | Admitting: Internal Medicine

## 2018-05-15 ENCOUNTER — Ambulatory Visit (INDEPENDENT_AMBULATORY_CARE_PROVIDER_SITE_OTHER): Payer: Managed Care, Other (non HMO) | Admitting: Internal Medicine

## 2018-05-15 VITALS — BP 132/84 | HR 85 | Temp 98.0°F | Ht 66.5 in | Wt 203.0 lb

## 2018-05-15 DIAGNOSIS — E785 Hyperlipidemia, unspecified: Secondary | ICD-10-CM

## 2018-05-15 DIAGNOSIS — Z0000 Encounter for general adult medical examination without abnormal findings: Secondary | ICD-10-CM

## 2018-05-15 DIAGNOSIS — I10 Essential (primary) hypertension: Secondary | ICD-10-CM

## 2018-05-15 DIAGNOSIS — Z23 Encounter for immunization: Secondary | ICD-10-CM

## 2018-05-15 LAB — COMPREHENSIVE METABOLIC PANEL
ALBUMIN: 4.4 g/dL (ref 3.5–5.2)
ALK PHOS: 74 U/L (ref 39–117)
ALT: 19 U/L (ref 0–35)
AST: 16 U/L (ref 0–37)
BILIRUBIN TOTAL: 0.6 mg/dL (ref 0.2–1.2)
BUN: 15 mg/dL (ref 6–23)
CALCIUM: 9.9 mg/dL (ref 8.4–10.5)
CHLORIDE: 102 meq/L (ref 96–112)
CO2: 30 mEq/L (ref 19–32)
CREATININE: 0.72 mg/dL (ref 0.40–1.20)
GFR: 87.06 mL/min (ref >60.00)
Glucose, Bld: 95 mg/dL (ref 70–99)
Potassium: 3.9 mEq/L (ref 3.5–5.1)
Sodium: 139 mEq/L (ref 135–145)
TOTAL PROTEIN: 7.6 g/dL (ref 6.0–8.3)

## 2018-05-15 LAB — CBC
HEMATOCRIT: 42.3 % (ref 36.0–46.0)
Hemoglobin: 14.5 g/dL (ref 12.0–15.0)
MCHC: 34.1 g/dL (ref 30.0–36.0)
MCV: 91.2 fl (ref 78.0–100.0)
PLATELETS: 302 10*3/uL (ref 150.0–400.0)
RBC: 4.64 Mil/uL (ref 3.87–5.11)
RDW: 12.3 % (ref 11.5–15.5)
WBC: 9.2 10*3/uL (ref 4.0–10.5)

## 2018-05-15 LAB — LIPID PANEL
CHOLESTEROL: 165 mg/dL (ref 0–200)
HDL: 65.4 mg/dL (ref >39.00)
LDL Cholesterol: 78 mg/dL (ref 0–99)
NonHDL: 99.37
TRIGLYCERIDES: 108 mg/dL (ref 0.0–149.0)
Total CHOL/HDL Ratio: 3
VLDL: 21.6 mg/dL (ref 0.0–40.0)

## 2018-05-15 NOTE — Progress Notes (Signed)
Subjective:    Patient ID: Tanya Gomez, female    DOB: 04/27/1956, 62 y.o.   MRN: [id number]  HPI Here for physical No new concerns Joined gym--but not going regularly Has lost 11# since last year  Continues on same meds Plans to work for a while  Current Outpatient Medications on File Prior to Visit  Medication Sig Dispense Refill  . atorvastatin (LIPITOR) 20 MG tablet TAKE 1 TABLET BY MOUTH DAILY. 90 tablet 0  . hydrochlorothiazide (MICROZIDE) 12.5 MG capsule TAKE 1 CAPSULE BY MOUTH EVERY MORNING 90 capsule 0  . Multiple Vitamin (MULTIVITAMIN) capsule Take 1 capsule by mouth daily.     No current facility-administered medications on file prior to visit.     No Known Allergies  Past Medical History:  Diagnosis Date  . History of shingles   . Hx of colonic polyps    adenomatous  . Hyperlipidemia   . Hypertension   . Osteoarthritis    no per pt 06-17-14  . Osteopenia 01/2018   T score -1.3 FRAX 7.5% / 0.6%    Past Surgical History:  Procedure Laterality Date  . COLONOSCOPY    . PATELLAR TENDON REPAIR  1999   left, spurs removed  . TUBAL LIGATION    . VAGINAL DELIVERY     x2    Family History  Problem Relation Age of Onset  . Diabetes Mother   . Stroke Mother   . Peripheral vascular disease Mother   . Coronary artery disease Father   . Diabetes Father   . Heart disease Father   . Brain cancer Maternal Grandmother   . Cancer Maternal Grandmother        brain  . Hypertension Neg Hx   . Hearing loss Neg Hx   . Colon cancer Neg Hx   . Esophageal cancer Neg Hx   . Stomach cancer Neg Hx   . Rectal cancer Neg Hx     Social History   Socioeconomic History  . Marital status: Divorced    Spouse name: Not on file  . Number of children: 2  . Years of education: Not on file  . Highest education level: Not on file  Occupational History  . Occupation: [email or web address]  Social Needs  . Financial resource strain: Not on file  .  Food insecurity:    Worry: Not on file    Inability: Not on file  . Transportation needs:    Medical: Not on file    Non-medical: Not on file  Tobacco Use  . Smoking status: Never Smoker  . Smokeless tobacco: Never Used  Substance and Sexual Activity  . Alcohol use: Yes    Alcohol/week: 0.0 standard drinks    Comment: occasional  . Drug use: No  . Sexual activity: Not Currently    Partners: Male    Comment: 1st intercourse- 76, partners- 2, divorced  Lifestyle  . Physical activity:    Days per week: Not on file    Minutes per session: Not on file  . Stress: Not on file  Relationships  . Social connections:    Talks on phone: Not on file    Gets together: Not on file    Attends religious service: Not on file    Active member of club or organization: Not on file    Attends meetings of clubs or organizations: Not on file    Relationship status: Not on file  . Intimate partner violence:  Fear of current or ex partner: Not on file    Emotionally abused: Not on file    Physically abused: Not on file    Forced sexual activity: Not on file  Other Topics Concern  . Not on file  Social History Narrative  . Not on file   Review of Systems  Constitutional: Negative for fatigue.       Wears seat belt  HENT: Negative for dental problem and trouble swallowing.        Right ear hearing loss---ENT evaluation negative (MRI) Some noise but no vertigo Keeps up with dentist  Eyes: Negative for visual disturbance.       No diplopia or unilateral vision loss  Respiratory: Negative for cough, chest tightness and shortness of breath.   Cardiovascular: Negative for chest pain, palpitations and leg swelling.  Gastrointestinal: Negative for abdominal pain, blood in stool and constipation.       No heartburn  Endocrine: Negative for polydipsia and polyuria.  Genitourinary: Negative for dyspareunia, dysuria and hematuria.  Musculoskeletal: Negative for arthralgias, back pain and joint  swelling.  Skin:       Mild psoriasis---uses epsom salt lotion Had shingles in June  Allergic/Immunologic: Negative for environmental allergies and immunocompromised state.  Neurological: Negative for dizziness, syncope, light-headedness and headaches.  Hematological: Negative for adenopathy. Does not bruise/bleed easily.  Psychiatric/Behavioral: Negative for dysphoric mood and sleep disturbance. The patient is not nervous/anxious.        Objective:   Physical Exam  Constitutional: She is oriented to person, place, and time. She appears well-developed. No distress.  HENT:  Head: Normocephalic and atraumatic.  Right Ear: External ear normal.  Left Ear: External ear normal.  Mouth/Throat: Oropharynx is clear and moist. No oropharyngeal exudate.  Eyes: Pupils are equal, round, and reactive to light. Conjunctivae are normal.  Neck: Normal range of motion. No thyromegaly present.  Cardiovascular: Normal rate, regular rhythm, normal heart sounds and intact distal pulses. Exam reveals no gallop.  No murmur heard. Respiratory: Effort normal and breath sounds normal. No respiratory distress. She has no wheezes. She has no rales.  GI: Soft. There is no tenderness.  Musculoskeletal: She exhibits no edema or tenderness.  Lymphadenopathy:    She has no cervical adenopathy.  Neurological: She is alert and oriented to person, place, and time.  Skin: No rash noted. No erythema.  Psychiatric: She has a normal mood and affect. Her behavior is normal.           Assessment & Plan:

## 2018-05-15 NOTE — Assessment & Plan Note (Signed)
No problems with statin for primary prevention 

## 2018-05-15 NOTE — Assessment & Plan Note (Signed)
BP Readings from Last 3 Encounters:  05/15/18 132/84  01/05/18 (!) 154/96  07/26/17 134/88   Good control Due for labs

## 2018-05-15 NOTE — Assessment & Plan Note (Signed)
Healthy but needs to work on fitness Will give shingrix when available Colon due 1/21 Flu vaccine today Last pap when 83 Mammogram every 1-2 years---had recently

## 2018-07-10 ENCOUNTER — Ambulatory Visit (INDEPENDENT_AMBULATORY_CARE_PROVIDER_SITE_OTHER): Payer: Managed Care, Other (non HMO)

## 2018-07-10 DIAGNOSIS — Z23 Encounter for immunization: Secondary | ICD-10-CM | POA: Diagnosis not present

## 2018-07-29 ENCOUNTER — Other Ambulatory Visit: Payer: Self-pay | Admitting: Internal Medicine

## 2018-08-14 ENCOUNTER — Other Ambulatory Visit: Payer: Self-pay | Admitting: Internal Medicine

## 2018-09-18 ENCOUNTER — Ambulatory Visit: Payer: Managed Care, Other (non HMO)

## 2018-09-18 ENCOUNTER — Ambulatory Visit (INDEPENDENT_AMBULATORY_CARE_PROVIDER_SITE_OTHER): Payer: Managed Care, Other (non HMO)

## 2018-09-18 ENCOUNTER — Other Ambulatory Visit: Payer: Self-pay

## 2018-09-18 DIAGNOSIS — Z23 Encounter for immunization: Secondary | ICD-10-CM | POA: Diagnosis not present

## 2018-11-18 IMAGING — MR MR HEAD WO/W CM
11 of 12 series · 43 of 48 positions shown · IV contrast (multihance)
Comparison: None.

CLINICAL DATA: RIGHT hearing loss for 4 years, tinnitus most
apparent at night.

EXAM:
MRI HEAD WITHOUT AND WITH CONTRAST
TECHNIQUE: Multiplanar, multiecho pulse sequences of the brain and surrounding
structures were obtained without and with intravenous contrast.
CONTRAST:  20mL MULTIHANCE GADOBENATE DIMEGLUMINE 529 MG/ML IV SOLN

[Series 5: T1 · sagittal · 4.0mm · 0.90mm/px · 3 of 27 slices shown (1 of 3)]
[im 1/27]
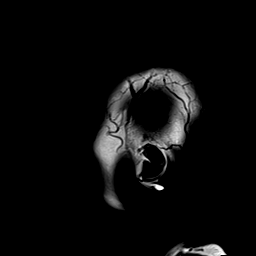
[im 14/27]
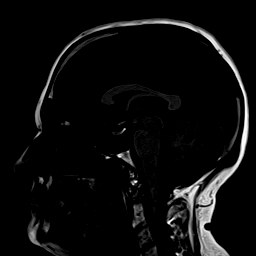
[im 27/27]
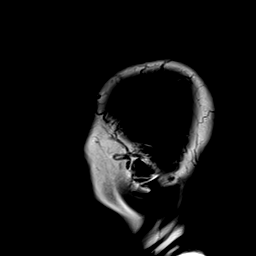

[Series 6: DWI · axial · 3.0mm · 1.44mm/px · z∈[-82,+81]mm · 7 of 86 slices shown (1 of 2)]
[im 1/86]
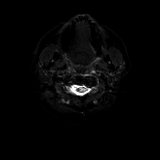
[im 15/86]
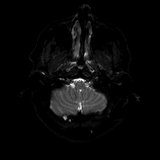
[im 29/86]
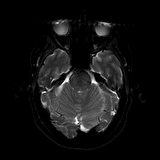
[im 43/86]
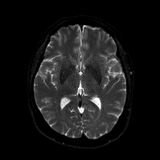
[im 57/86]
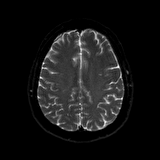
[im 71/86]
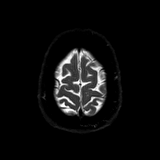
[im 86/86]
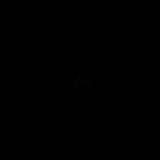

[Series 7: DWI · axial · 3.0mm · 1.44mm/px · z∈[-82,+81]mm · 3 of 43 slices shown (2 of 2)]
[im 1/43]
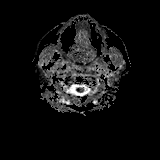
[im 22/43]
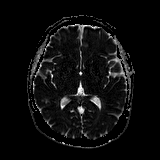
[im 43/43]
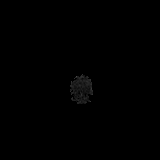

[Series 8: T2 · axial · 4.0mm · 0.45mm/px · z∈[-79,+76]mm · 3 of 31 slices shown]
[im 1/31]
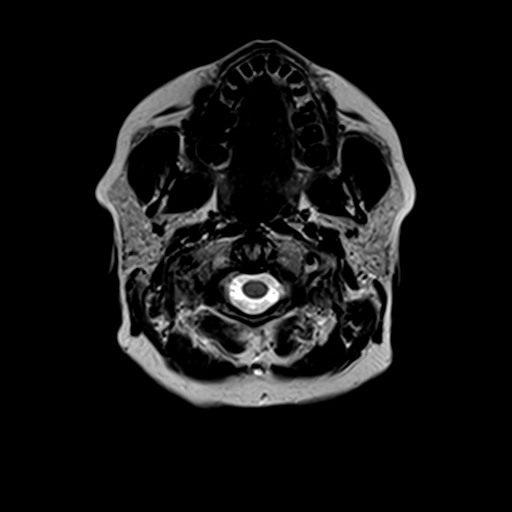
[im 16/31]
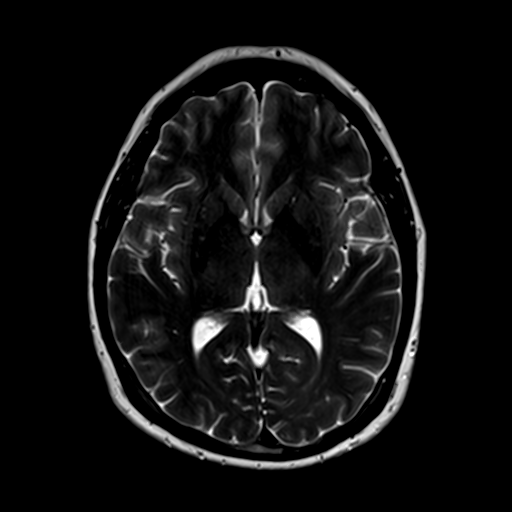
[im 31/31]
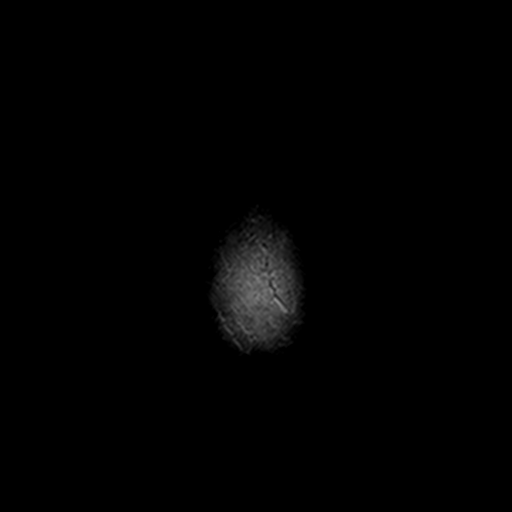

[Series 9: FLAIR · axial · 3.0mm · 0.72mm/px · z∈[-82,+79]mm · 2 of 28 slices shown]
[im 1/28]
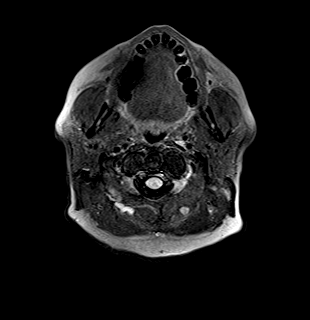
[im 28/28]
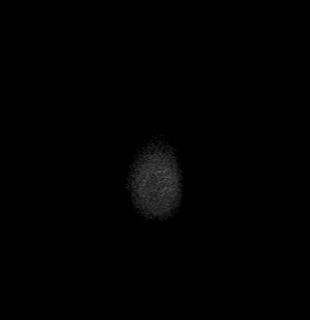

[Series 11: swi_images · axial · 1.5mm · 0.90mm/px · z∈[-71,+70]mm · 8 of 96 slices shown]
[im 1/96]
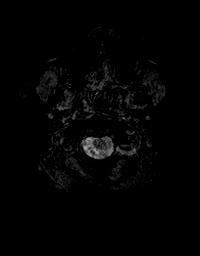
[im 14/96]
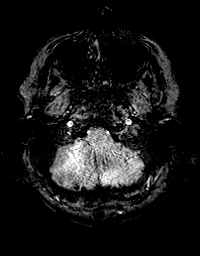
[im 28/96]
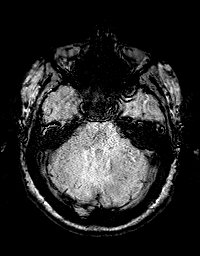
[im 41/96]
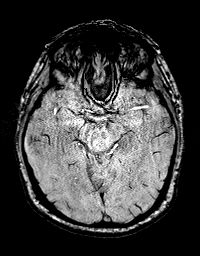
[im 55/96]
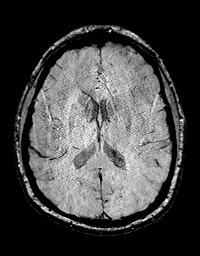
[im 68/96]
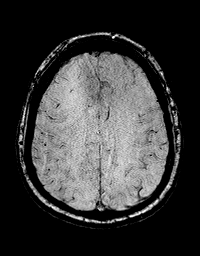
[im 82/96]
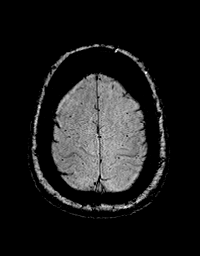
[im 96/96]
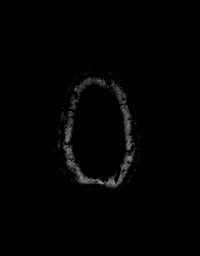

[Series 12: T1 · coronal · 2.5mm · 0.70mm/px · 1 of 15 slices shown (2 of 3)]
[im 1/15]
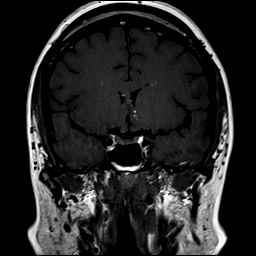

[Series 13: T1 · axial · 2.5mm · 0.62mm/px · 1 of 13 slices shown (3 of 3)]
[im 1/13]
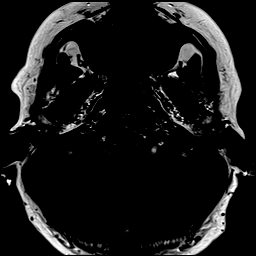

[Series 15: T1 post-contrast · coronal · 2.5mm · 0.70mm/px · 1 of 15 slices shown (1 of 3)]
[im 1/15]
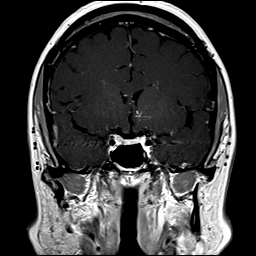

[Series 16: T1 post-contrast · axial · 2.5mm · 0.62mm/px · 1 of 13 slices shown (2 of 3)]
[im 1/13]
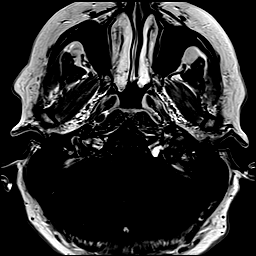

[Series 17: T1 post-contrast · axial · 1.0mm · 0.90mm/px · z∈[-81,+77]mm · 13 of 160 slices shown (3 of 3)]
[im 1/160]
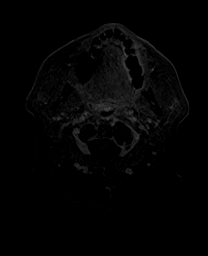
[im 14/160]
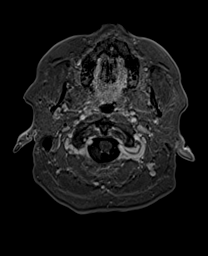
[im 27/160]
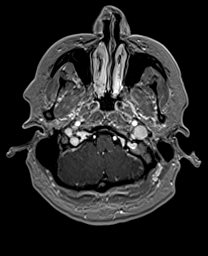
[im 40/160]
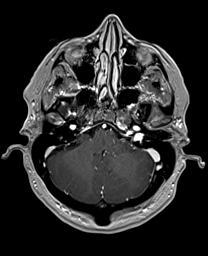
[im 54/160]
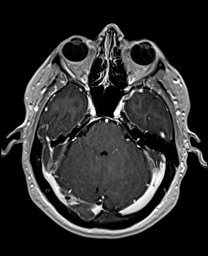
[im 67/160]
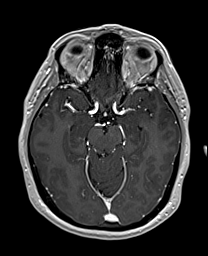
[im 80/160]
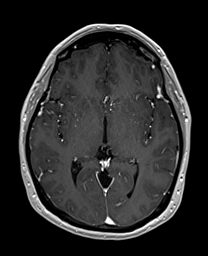
[im 93/160]
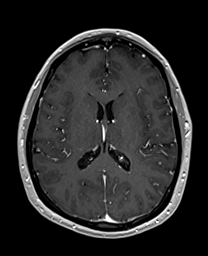
[im 107/160]
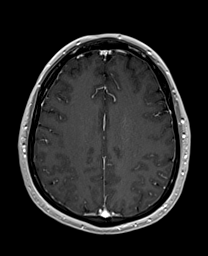
[im 120/160]
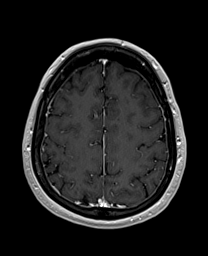
[im 133/160]
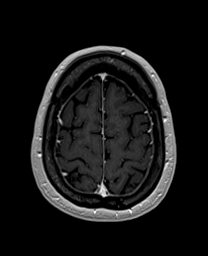
[im 146/160]
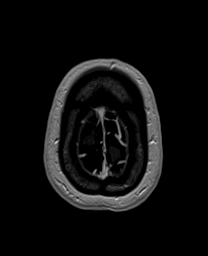
[im 160/160]
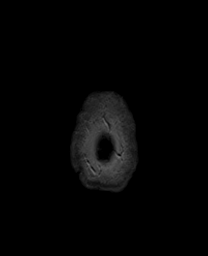

[43 of 48 positions shown; findings below may reference images not displayed]

FINDINGS: INTERNAL AUDITORY CANALS: No cerebellar pontine angle masses. Normal
course, caliber and signal of the bilateral seventh and eighth
cranial nerves without abnormal enhancement. Symmetric, normal
internal auditory canals. Normal appearance of the inner ear
structures.

INTRACRANIAL CONTENTS: No reduced diffusion to suggest acute
ischemia or hypercellular tumor. No susceptibility artifact to
suggest hemorrhage. The ventricles and sulci are normal for
patient's age. No suspicious parenchymal signal, mass lesions, mass
effect. No abnormal intraparenchymal or extra-axial enhancement. No
abnormal extra-axial fluid collections. No extra-axial masses.
Cerebellar tonsils descend 2-3 mm below the foramen magnum though
are not pointed in appearance an there is ample cerebral spinal
fluid space.

VASCULAR: Normal major intracranial vascular flow voids present at
skull base.

SKULL AND UPPER CERVICAL SPINE: No abnormal sellar expansion. No
suspicious calvarial bone marrow signal. Craniocervical junction
maintained.

SINUSES/ ORBITS: The mastoid air-cells and included paranasal
sinuses are well-aerated.The included ocular globes and orbital
contents are non-suspicious.

OTHER: None.
IMPRESSION: 1. Normal MRI of the internal auditory canals with and without
contrast.
2. Negative MRI of the head with and without contrast for age.

## 2019-01-09 ENCOUNTER — Other Ambulatory Visit: Payer: Self-pay

## 2019-01-10 ENCOUNTER — Ambulatory Visit (INDEPENDENT_AMBULATORY_CARE_PROVIDER_SITE_OTHER): Payer: Managed Care, Other (non HMO) | Admitting: Obstetrics & Gynecology

## 2019-01-10 ENCOUNTER — Encounter: Payer: Self-pay | Admitting: Obstetrics & Gynecology

## 2019-01-10 VITALS — BP 138/86 | Ht 66.0 in | Wt 202.0 lb

## 2019-01-10 DIAGNOSIS — Z01419 Encounter for gynecological examination (general) (routine) without abnormal findings: Secondary | ICD-10-CM | POA: Diagnosis not present

## 2019-01-10 DIAGNOSIS — M8589 Other specified disorders of bone density and structure, multiple sites: Secondary | ICD-10-CM

## 2019-01-10 DIAGNOSIS — Z780 Asymptomatic menopausal state: Secondary | ICD-10-CM

## 2019-01-10 NOTE — Patient Instructions (Signed)
1. Well female exam with routine gynecological exam Normal gynecologic exam in menopause.  Pap test July 2019 was negative, no indication to repeat this year.  Breast exam normal.  Will schedule screening mammogram August 2020.  Colonoscopy 2016, on a 5-year schedule.  Health labs with family physician.  2. Postmenopause Well on no hormone replacement therapy.  No postmenopausal bleeding.  3. Osteopenia of multiple sites Osteopenia on bone density August 2019.  Will repeat a bone density August 2021.  Vitamin D supplements, calcium intake of 1200 mg daily and regular weightbearing physical activities recommended.  Tanya Gomez, it was a pleasure seeing you today!

## 2019-01-10 NOTE — Progress Notes (Signed)
Tanya Gomez 04-08-1956 [id number]   History:    63 y.o. G2P2L2 Divorced.  2 children with grand-children.  RP:  Established patient presenting for annual gyn exam   HPI: Menopause, well on no hormone replacement therapy.  No postmenopausal bleeding.  No pelvic pain.  Abstinent.  No urgency or stress urinary incontinence.  Bowel movements normal.  Breasts normal.  Body mass index 32.6. Lost 8 Lbs x last year.  Walking the dog.  Health labs with family physician.  Past medical history,surgical history, family history and social history were all reviewed and documented in the EPIC chart.  Gynecologic History No LMP recorded. Patient is postmenopausal. Contraception: abstinence and post menopausal status Last Pap: 12/2017. Results were: Negative Last mammogram: 01/2018. Results were: Benign Bone Density: 01/2018 Ostepenia Colonoscopy: 2016 on a 5 yr schedule  Obstetric History OB History  Gravida Para Term Preterm AB Living  2 2       2   SAB TAB Ectopic Multiple Live Births               # Outcome Date GA Lbr Len/2nd Weight Sex Delivery Anes PTL Lv  2 Para      Vag-Spont     1 Para      Vag-Spont        ROS: A ROS was performed and pertinent positives and negatives are included in the history.  GENERAL: No fevers or chills. HEENT: No change in vision, no earache, sore throat or sinus congestion. NECK: No pain or stiffness. CARDIOVASCULAR: No chest pain or pressure. No palpitations. PULMONARY: No shortness of breath, cough or wheeze. GASTROINTESTINAL: No abdominal pain, nausea, vomiting or diarrhea, melena or bright red blood per rectum. GENITOURINARY: No urinary frequency, urgency, hesitancy or dysuria. MUSCULOSKELETAL: No joint or muscle pain, no back pain, no recent trauma. DERMATOLOGIC: No rash, no itching, no lesions. ENDOCRINE: No polyuria, polydipsia, no heat or cold intolerance. No recent change in weight. HEMATOLOGICAL: No anemia or easy bruising or bleeding. NEUROLOGIC: No  headache, seizures, numbness, tingling or weakness. PSYCHIATRIC: No depression, no loss of interest in normal activity or change in sleep pattern.     Exam:   BP 138/86    Ht 5\' 6"  (1.676 m)    Wt 202 lb (91.6 kg)    BMI 32.60 kg/m   Body mass index is 32.6 kg/m.  General appearance : Well developed well nourished female. No acute distress HEENT: Eyes: no retinal hemorrhage or exudates,  Neck supple, trachea midline, no carotid bruits, no thyroidmegaly Lungs: Clear to auscultation, no rhonchi or wheezes, or rib retractions  Heart: Regular rate and rhythm, no murmurs or gallops Breast:Examined in sitting and supine position were symmetrical in appearance, no palpable masses or tenderness,  no skin retraction, no nipple inversion, no nipple discharge, no skin discoloration, no axillary or supraclavicular lymphadenopathy Abdomen: no palpable masses or tenderness, no rebound or guarding Extremities: no edema or skin discoloration or tenderness  Pelvic: Vulva: Normal             Vagina: No gross lesions or discharge  Cervix: No gross lesions or discharge  Uterus  AV, normal size, shape and consistency, non-tender and mobile  Adnexa  Without masses or tenderness  Anus: Normal   Assessment/Plan:  63 y.o. female for annual exam   1. Well female exam with routine gynecological exam Normal gynecologic exam in menopause.  Pap test July 2019 was negative, no indication to repeat this year.  Breast  exam normal.  Will schedule screening mammogram August 2020.  Colonoscopy 2016, on a 5-year schedule.  Health labs with family physician.  2. Postmenopause Well on no hormone replacement therapy.  No postmenopausal bleeding.  3. Osteopenia of multiple sites Osteopenia on bone density August 2019.  Will repeat a bone density August 2021.  Vitamin D supplements, calcium intake of 1200 mg daily and regular weightbearing physical activities recommended.  Princess Bruins MD, 8:14 AM 01/10/2019

## 2019-01-28 ENCOUNTER — Encounter: Payer: Self-pay | Admitting: Obstetrics & Gynecology

## 2019-03-12 ENCOUNTER — Encounter: Payer: Self-pay | Admitting: Gynecology

## 2019-05-07 ENCOUNTER — Encounter: Payer: Self-pay | Admitting: Family Medicine

## 2019-05-07 ENCOUNTER — Ambulatory Visit (INDEPENDENT_AMBULATORY_CARE_PROVIDER_SITE_OTHER): Payer: Managed Care, Other (non HMO) | Admitting: Family Medicine

## 2019-05-07 ENCOUNTER — Other Ambulatory Visit: Payer: Self-pay

## 2019-05-07 VITALS — BP 134/72 | HR 88 | Temp 98.2°F | Ht 66.0 in | Wt 208.8 lb

## 2019-05-07 DIAGNOSIS — M25562 Pain in left knee: Secondary | ICD-10-CM | POA: Diagnosis not present

## 2019-05-07 NOTE — Patient Instructions (Signed)
#  Knee pain - Low suspicion for broken bone - work on trying to bend your leg as tolerated - continue ice and ibuprofen   - Call on Monday if still in a lot of pain or if unable to bend your leg still

## 2019-05-07 NOTE — Progress Notes (Addendum)
Subjective:     Tanya Gomez is a 63 y.o. female presenting for Knee Pain (left knee, fell x sunday, difficult to walk on, hard to bend )     HPI   #Left Knee pain - fell on Sunday  - was able to walk  - having difficulty bending the knee - has been icing and doing ibuprofen - was walking her dog and she was running around her and she tripped and fell forward on the top her of knee  - knees were bent when she fell - hx of knee cap/tendon removal on the left knee - did swell initially, that has improved - worse swelling when she is on the knee - Pain - posterior leg and knee, traveling up the thigh   Review of Systems  Constitutional: Negative for chills and fever.  Musculoskeletal: Positive for joint swelling.  Skin: Negative for wound.     Social History   Tobacco Use  Smoking Status Never Smoker  Smokeless Tobacco Never Used        Objective:    BP Readings from Last 3 Encounters:  05/07/19 134/72  01/10/19 138/86  05/15/18 132/84   Wt Readings from Last 3 Encounters:  05/07/19 208 lb 12.8 oz (94.7 kg)  01/10/19 202 lb (91.6 kg)  05/15/18 203 lb (92.1 kg)    BP 134/72   Pulse 88   Temp 98.2 F (36.8 C)   Ht 5\' 6"  (1.676 m)   Wt 208 lb 12.8 oz (94.7 kg)   SpO2 97%   BMI 33.70 kg/m    Physical Exam Constitutional:      General: She is not in acute distress.    Appearance: She is well-developed. She is not diaphoretic.  HENT:     Right Ear: External ear normal.     Left Ear: External ear normal.     Nose: Nose normal.  Eyes:     Conjunctiva/sclera: Conjunctivae normal.  Neck:     Musculoskeletal: Neck supple.  Cardiovascular:     Rate and Rhythm: Normal rate.  Pulmonary:     Effort: Pulmonary effort is normal.  Musculoskeletal:     Comments: Ambulating with the left leg in extension.  Left knee:  Inspection: trace swelling, no bruising, some scraps, healed scar from prior surgery Palpation: No TTP along the joint line, patella,  posterior knee, lower hamstring, or tibia/fibia  ROM: normal extension, flexion limited to about 80 degrees. Pt also resisting passive flexion on exam Ligaments: guarded exam, but no obvious ligament injury appear to be intact   Skin:    General: Skin is warm and dry.     Capillary Refill: Capillary refill takes less than 2 seconds.  Neurological:     Mental Status: She is alert. Mental status is at baseline.  Psychiatric:        Mood and Affect: Mood normal.        Behavior: Behavior normal.           Assessment & Plan:   Problem List Items Addressed This Visit    None    Visit Diagnoses    Acute pain of left knee    -  Primary     Pt having difficulty with weight bearing and flexion which I suspect is related to the trauma. Given lack of any TTP on exam lower suspicion for a fracture at this time and suspect the inflammation is contributing to the knee pain and ROM changes.  Advised slowly trying to progress ROM and if not approaching baseline in 1 week, return to clinic for XR.   This visit occurred during the SARS-CoV-2 public health emergency.  Safety protocols were in place, including screening questions prior to the visit, additional usage of staff PPE, and extensive cleaning of exam room while observing appropriate contact time as indicated for disinfecting solutions.      Return in about 1 week (around 05/14/2019).  Lesleigh Noe, MD

## 2019-05-20 ENCOUNTER — Other Ambulatory Visit: Payer: Self-pay

## 2019-05-21 ENCOUNTER — Ambulatory Visit (INDEPENDENT_AMBULATORY_CARE_PROVIDER_SITE_OTHER): Payer: Managed Care, Other (non HMO) | Admitting: Internal Medicine

## 2019-05-21 ENCOUNTER — Encounter: Payer: Self-pay | Admitting: Internal Medicine

## 2019-05-21 VITALS — BP 122/82 | HR 99 | Temp 97.6°F | Ht 66.5 in | Wt 208.0 lb

## 2019-05-21 DIAGNOSIS — I10 Essential (primary) hypertension: Secondary | ICD-10-CM

## 2019-05-21 DIAGNOSIS — Z0000 Encounter for general adult medical examination without abnormal findings: Secondary | ICD-10-CM

## 2019-05-21 DIAGNOSIS — L409 Psoriasis, unspecified: Secondary | ICD-10-CM

## 2019-05-21 DIAGNOSIS — Z23 Encounter for immunization: Secondary | ICD-10-CM | POA: Diagnosis not present

## 2019-05-21 DIAGNOSIS — E785 Hyperlipidemia, unspecified: Secondary | ICD-10-CM | POA: Diagnosis not present

## 2019-05-21 LAB — LIPID PANEL
Cholesterol: 173 mg/dL (ref 0–200)
HDL: 72.4 mg/dL (ref >39.00)
LDL Cholesterol: 83 mg/dL (ref 0–99)
NonHDL: 100.41
Total CHOL/HDL Ratio: 2
Triglycerides: 86 mg/dL (ref 0.0–149.0)
VLDL: 17.2 mg/dL (ref 0.0–40.0)

## 2019-05-21 LAB — CBC
HCT: 43.3 % (ref 36.0–46.0)
Hemoglobin: 14.5 g/dL (ref 12.0–15.0)
MCHC: 33.6 g/dL (ref 30.0–36.0)
MCV: 92.3 fl (ref 78.0–100.0)
Platelets: 299 10*3/uL (ref 150.0–400.0)
RBC: 4.69 Mil/uL (ref 3.87–5.11)
RDW: 12 % (ref 11.5–15.5)
WBC: 8.4 10*3/uL (ref 4.0–10.5)

## 2019-05-21 LAB — COMPREHENSIVE METABOLIC PANEL
ALT: 17 U/L (ref 0–35)
AST: 17 U/L (ref 0–37)
Albumin: 4.4 g/dL (ref 3.5–5.2)
Alkaline Phosphatase: 76 U/L (ref 39–117)
BUN: 19 mg/dL (ref 6–23)
CO2: 30 mEq/L (ref 19–32)
Calcium: 9.6 mg/dL (ref 8.4–10.5)
Chloride: 103 mEq/L (ref 96–112)
Creatinine, Ser: 0.73 mg/dL (ref 0.40–1.20)
GFR: 80.36 mL/min (ref >60.00)
Glucose, Bld: 92 mg/dL (ref 70–99)
Potassium: 4 mEq/L (ref 3.5–5.1)
Sodium: 140 mEq/L (ref 135–145)
Total Bilirubin: 0.7 mg/dL (ref 0.2–1.2)
Total Protein: 7 g/dL (ref 6.0–8.3)

## 2019-05-21 MED ORDER — TRIAMCINOLONE ACETONIDE 0.1 % EX CREA: 1.0000 "application " | TOPICAL_CREAM | Freq: Two times a day (BID) | CUTANEOUS | 1 refills | Status: DC | PRN

## 2019-05-21 NOTE — Assessment & Plan Note (Signed)
No problems with statin for primary prevention 

## 2019-05-21 NOTE — Assessment & Plan Note (Signed)
BP Readings from Last 3 Encounters:  05/21/19 122/82  05/07/19 134/72  01/10/19 138/86   Good control Will check labs

## 2019-05-21 NOTE — Assessment & Plan Note (Signed)
Will Rx TAC

## 2019-05-21 NOTE — Assessment & Plan Note (Signed)
Healthy but obese Discussed exercise Flu vaccine today Colon due next month Recent mammogram Pap not due yet

## 2019-05-21 NOTE — Patient Instructions (Signed)

## 2019-05-21 NOTE — Progress Notes (Signed)
Subjective:    Patient ID: Tanya Gomez, female    DOB: [birth year], 63 y.o.   MRN: [id number]  HPI Here for physical  This visit occurred during the SARS-CoV-2 public health emergency.  Safety protocols were in place, including screening questions prior to the visit, additional usage of staff PPE, and extensive cleaning of exam room while observing appropriate contact time as indicated for disinfecting solutions.   Same job Only 4 people come into the office--so okay with social distancing Does her shopping  No new health concerns Left knee is better--after fall walking dog Swelling is better  Same medications No problems with them  Current Outpatient Medications on File Prior to Visit  Medication Sig Dispense Refill  . atorvastatin (LIPITOR) 20 MG tablet TAKE 1 TABLET BY MOUTH DAILY. 90 tablet 3  . hydrochlorothiazide (MICROZIDE) 12.5 MG capsule TAKE 1 CAPSULE BY MOUTH EVERY MORNING 90 capsule 3  . Multiple Vitamin (MULTIVITAMIN) capsule Take 1 capsule by mouth daily.     No current facility-administered medications on file prior to visit.     No Known Allergies  Past Medical History:  Diagnosis Date  . History of shingles   . Hx of colonic polyps    adenomatous  . Hyperlipidemia   . Hypertension   . Osteoarthritis    no per pt 06-17-14  . Osteopenia 01/2018   T score -1.3 FRAX 7.5% / 0.6%    Past Surgical History:  Procedure Laterality Date  . COLONOSCOPY    . PATELLAR TENDON REPAIR  1999   left, spurs removed  . TUBAL LIGATION    . VAGINAL DELIVERY     x2    Family History  Problem Relation Age of Onset  . Diabetes Mother   . Stroke Mother   . Peripheral vascular disease Mother   . Coronary artery disease Father   . Diabetes Father   . Heart disease Father   . Brain cancer Maternal Grandmother   . Cancer Maternal Grandmother        brain  . Hypertension Neg Hx   . Hearing loss Neg Hx   . Colon cancer Neg Hx   . Esophageal cancer Neg Hx   .  Stomach cancer Neg Hx   . Rectal cancer Neg Hx     Social History   Socioeconomic History  . Marital status: Divorced    Spouse name: Not on file  . Number of children: 2  . Years of education: Not on file  . Highest education level: Not on file  Occupational History  . Occupation: [email or web address]  Social Needs  . Financial resource strain: Not on file  . Food insecurity    Worry: Not on file    Inability: Not on file  . Transportation needs    Medical: Not on file    Non-medical: Not on file  Tobacco Use  . Smoking status: Never Smoker  . Smokeless tobacco: Never Used  Substance and Sexual Activity  . Alcohol use: Yes    Alcohol/week: 0.0 standard drinks    Comment: occasional  . Drug use: No  . Sexual activity: Not Currently    Partners: Male    Comment: 1st intercourse- 25, partners- 2, divorced  Lifestyle  . Physical activity    Days per week: Not on file    Minutes per session: Not on file  . Stress: Not on file  Relationships  . Social 16 on phone:  Not on file    Gets together: Not on file    Attends religious service: Not on file    Active member of club or organization: Not on file    Attends meetings of clubs or organizations: Not on file    Relationship status: Not on file  . Intimate partner violence    Fear of current or ex partner: Not on file    Emotionally abused: Not on file    Physically abused: Not on file    Forced sexual activity: Not on file  Other Topics Concern  . Not on file  Social History Narrative  . Not on file   Review of Systems  Constitutional:       Weight up a little No exercise Wears seat belt  HENT: Positive for tinnitus. Negative for dental problem and trouble swallowing.        Poor hearing in right ear chronically Keeps up with dentist  Eyes: Negative for visual disturbance.       No diplopia or unilateral vision loss  Respiratory: Negative for cough, chest tightness and  shortness of breath.   Cardiovascular: Negative for chest pain, palpitations and leg swelling.  Gastrointestinal: Negative for abdominal pain, blood in stool and constipation.       Occ heartburn--OTC works  Endocrine: Negative for polydipsia and polyuria.  Genitourinary: Negative for dyspareunia, dysuria and hematuria.  Musculoskeletal: Positive for myalgias. Negative for arthralgias and back pain.       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       Some sinus symptoms with weather changes--advil sinus helps  Neurological: Negative for dizziness, syncope and light-headedness.       Occ sinus headaches  Hematological: Negative for adenopathy. Does not bruise/bleed easily.  Psychiatric/Behavioral: Negative for dysphoric mood and sleep disturbance. The patient is not nervous/anxious.        Objective:   Physical Exam  Constitutional: She is oriented to person, place, and time. She appears well-developed. No distress.  HENT:  Head: Normocephalic and atraumatic.  Right Ear: External ear normal.  Left Ear: External ear normal.  Mouth/Throat: Oropharynx is clear and moist. No oropharyngeal exudate.  Eyes: Pupils are equal, round, and reactive to light. Conjunctivae are normal.  Neck: No thyromegaly present.  Cardiovascular: Normal rate, regular rhythm, normal heart sounds and intact distal pulses. Exam reveals no gallop.  No murmur heard. Respiratory: Effort normal and breath sounds normal. No respiratory distress. She has no wheezes. She has no rales.  GI: Soft. There is no abdominal tenderness.  Musculoskeletal:        General: No tenderness or edema.  Lymphadenopathy:    She has no cervical adenopathy.  Neurological: She is alert and oriented to person, place, and time.  Skin: No erythema.  Mild psoriatic changes in hands and plantar feet  Psychiatric: She has a normal  mood and affect. Her behavior is normal.           Assessment & Plan:

## 2019-07-01 ENCOUNTER — Ambulatory Visit (INDEPENDENT_AMBULATORY_CARE_PROVIDER_SITE_OTHER): Payer: Managed Care, Other (non HMO) | Admitting: Internal Medicine

## 2019-07-01 ENCOUNTER — Encounter: Payer: Self-pay | Admitting: Internal Medicine

## 2019-07-01 DIAGNOSIS — J0100 Acute maxillary sinusitis, unspecified: Secondary | ICD-10-CM | POA: Diagnosis not present

## 2019-07-01 MED ORDER — AMOXICILLIN 500 MG PO TABS: 1000.0000 mg | ORAL_TABLET | Freq: Two times a day (BID) | ORAL | 0 refills | Status: AC

## 2019-07-01 NOTE — Progress Notes (Signed)
Subjective:    Patient ID: Tanya Gomez, female    DOB: 01-06-1956, 63 y.o.   MRN: [id number]  HPI Video virtual visit due to sinus symptoms Identification done Reviewed billing and she gave consent Participants---patient in her home and I am in my office  Has had sinus congestion since last week Trying advil sinus Now with left ear and sinus pain since 3 days ago Very slight cough only No SOB No fever Will drain/rhinorrhea with mucinex No sore throat  Current Outpatient Medications on File Prior to Visit  Medication Sig Dispense Refill  . atorvastatin (LIPITOR) 20 MG tablet TAKE 1 TABLET BY MOUTH DAILY. 90 tablet 3  . hydrochlorothiazide (MICROZIDE) 12.5 MG capsule TAKE 1 CAPSULE BY MOUTH EVERY MORNING 90 capsule 3  . Multiple Vitamin (MULTIVITAMIN) capsule Take 1 capsule by mouth daily.    Marland Kitchen triamcinolone cream (KENALOG) 0.1 % Apply 1 application topically 2 (two) times daily as needed. 45 g 1   No current facility-administered medications on file prior to visit.    No Known Allergies  Past Medical History:  Diagnosis Date  . History of shingles   . Hx of colonic polyps    adenomatous  . Hyperlipidemia   . Hypertension   . Osteoarthritis    no per pt 06-17-14  . Osteopenia 01/2018   T score -1.3 FRAX 7.5% / 0.6%    Past Surgical History:  Procedure Laterality Date  . COLONOSCOPY    . PATELLAR TENDON REPAIR  1999   left, spurs removed  . TUBAL LIGATION    . VAGINAL DELIVERY     x2    Family History  Problem Relation Age of Onset  . Diabetes Mother   . Stroke Mother   . Peripheral vascular disease Mother   . Coronary artery disease Father   . Diabetes Father   . Heart disease Father   . Brain cancer Maternal Grandmother   . Cancer Maternal Grandmother        brain  . Hypertension Neg Hx   . Hearing loss Neg Hx   . Colon cancer Neg Hx   . Esophageal cancer Neg Hx   . Stomach cancer Neg Hx   . Rectal cancer Neg Hx     Social History    Socioeconomic History  . Marital status: Divorced    Spouse name: Not on file  . Number of children: 2  . Years of education: Not on file  . Highest education level: Not on file  Occupational History  . Occupation: [email or web address]  Tobacco Use  . Smoking status: Never Smoker  . Smokeless tobacco: Never Used  Substance and Sexual Activity  . Alcohol use: Yes    Alcohol/week: 0.0 standard drinks    Comment: occasional  . Drug use: No  . Sexual activity: Not Currently    Partners: Male    Comment: 1st intercourse- 53, partners- 2, divorced  Other Topics Concern  . Not on file  Social History Narrative  . Not on file   Social Determinants of Health   Financial Resource Strain:   . Difficulty of Paying Living Expenses: Not on file  Food Insecurity:   . Worried About 16 in the Last Year: Not on file  . Ran Out of Food in the Last Year: Not on file  Transportation Needs:   . Lack of Transportation (Medical): Not on file  . Lack of Transportation (Non-Medical): Not on file  Physical Activity:   . Days of Exercise per Week: Not on file  . Minutes of Exercise per Session: Not on file  Stress:   . Feeling of Stress : Not on file  Social Connections:   . Frequency of Communication with Friends and Family: Not on file  . Frequency of Social Gatherings with Friends and Family: Not on file  . Attends Religious Services: Not on file  . Active Member of Clubs or Organizations: Not on file  . Attends Archivist Meetings: Not on file  . Marital Status: Not on file  Intimate Partner Violence:   . Fear of Current or Ex-Partner: Not on file  . Emotionally Abused: Not on file  . Physically Abused: Not on file  . Sexually Abused: Not on file   Review of Systems No change in smell or taste Still works in the office No N/V/diarrhea    Objective:   Physical Exam  Constitutional: She appears well-developed. No distress.  HENT:   Left maxillary tenderness when she touches  Neck:  No apparent nodes  Respiratory: Effort normal. No respiratory distress.           Assessment & Plan:

## 2019-07-01 NOTE — Assessment & Plan Note (Signed)
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

## 2019-09-04 ENCOUNTER — Other Ambulatory Visit: Payer: Self-pay | Admitting: Internal Medicine

## 2019-09-12 ENCOUNTER — Other Ambulatory Visit: Payer: Self-pay | Admitting: Internal Medicine

## 2019-09-30 ENCOUNTER — Ambulatory Visit (INDEPENDENT_AMBULATORY_CARE_PROVIDER_SITE_OTHER): Payer: Managed Care, Other (non HMO) | Admitting: Internal Medicine

## 2019-09-30 ENCOUNTER — Telehealth: Payer: Self-pay | Admitting: Internal Medicine

## 2019-09-30 ENCOUNTER — Encounter: Payer: Self-pay | Admitting: Internal Medicine

## 2019-09-30 ENCOUNTER — Other Ambulatory Visit: Payer: Self-pay

## 2019-09-30 DIAGNOSIS — R1013 Epigastric pain: Secondary | ICD-10-CM | POA: Insufficient documentation

## 2019-09-30 NOTE — Assessment & Plan Note (Signed)
3 days of cramping and felt gassy Then went to back and around flank--though some of that was after the dog pulled her and may have strained muscles Certainly could be gastritis--right location but pain not typical Could be GERD--especially with reflux spell Gallbladder problems are very likely---but with resolution of symptoms, not sure I want to check ultrasound (has high likelihood of incidental gallstones which might not have caused symptoms).  Doubt pancreatitis She had wondered about cardiac etiology--not consistent with that  Reviewed options Discussed ultrasound---we will plan to do this if pain recurs Asked her to try PPI empirically for 2 weeks just in case No testing for now

## 2019-09-30 NOTE — Telephone Encounter (Signed)
Okay I will see her then

## 2019-09-30 NOTE — Telephone Encounter (Signed)
Pt calling for an appt today Pt c/o abd pain and cramping x 3 days.  Denies N/V.  Pain also radiates into lower back.  NO Diarrhea, constipation or UTI Sx's Denies fever or any other symptoms.   Tried Advil for pain, Tums for cramping and "gas" feeling. Some relief but not long term  We are limited on appts today.  Please advise Dr Silvio Pate is able to see in office or if can be worked in [facility]

## 2019-09-30 NOTE — Telephone Encounter (Signed)
Spoke to pt. Made appt for 2pm today in office.

## 2019-09-30 NOTE — Progress Notes (Signed)
Subjective:    Patient ID: Tanya Gomez, female    DOB: 29-Aug-1955, 64 y.o.   MRN: [id number]  HPI Here due to abdominal pain This visit occurred during the SARS-CoV-2 public health emergency.  Safety protocols were in place, including screening questions prior to the visit, additional usage of staff PPE, and extensive cleaning of exam room while observing appropriate contact time as indicated for disinfecting solutions.   Started 3 days ago Points to epigastrium Cramping, bloating and gas No nausea or vomiting Started after eating some popcorn --not dinner Then started radiating to her back Then got pulled by dog on the leash--made it worse 2 days ago Then cramping in right posterior flank  Has had one acid belch in the past 3 days No dysphagia Does take ibuprofen--2 probably three times for the pain. Helped Also tried tums--this helped also Heating pad on back also seemed to help some  Feels better now Ham sandwich today for lunch--no problems with that  Current Outpatient Medications on File Prior to Visit  Medication Sig Dispense Refill  . atorvastatin (LIPITOR) 20 MG tablet TAKE 1 TABLET BY MOUTH DAILY. 90 tablet 3  . hydrochlorothiazide (MICROZIDE) 12.5 MG capsule TAKE 1 CAPSULE BY MOUTH EVERY MORNING 90 capsule 3  . Multiple Vitamin (MULTIVITAMIN) capsule Take 1 capsule by mouth daily.    Marland Kitchen triamcinolone cream (KENALOG) 0.1 % Apply 1 application topically 2 (two) times daily as needed. 45 g 1   No current facility-administered medications on file prior to visit.    No Known Allergies  Past Medical History:  Diagnosis Date  . History of shingles   . Hx of colonic polyps    adenomatous  . Hyperlipidemia   . Hypertension   . Osteoarthritis    no per pt 06-17-14  . Osteopenia 01/2018   T score -1.3 FRAX 7.5% / 0.6%    Past Surgical History:  Procedure Laterality Date  . COLONOSCOPY    . PATELLAR TENDON REPAIR  1999   left, spurs removed  . TUBAL LIGATION     . VAGINAL DELIVERY     x2    Family History  Problem Relation Age of Onset  . Diabetes Mother   . Stroke Mother   . Peripheral vascular disease Mother   . Coronary artery disease Father   . Diabetes Father   . Heart disease Father   . Brain cancer Maternal Grandmother   . Cancer Maternal Grandmother        brain  . Hypertension Neg Hx   . Hearing loss Neg Hx   . Colon cancer Neg Hx   . Esophageal cancer Neg Hx   . Stomach cancer Neg Hx   . Rectal cancer Neg Hx     Social History   Socioeconomic History  . Marital status: Divorced    Spouse name: Not on file  . Number of children: 2  . Years of education: Not on file  . Highest education level: Not on file  Occupational History  . Occupation: [email or web address]  Tobacco Use  . Smoking status: Never Smoker  . Smokeless tobacco: Never Used  Substance and Sexual Activity  . Alcohol use: Yes    Alcohol/week: 0.0 standard drinks    Comment: occasional  . Drug use: No  . Sexual activity: Not Currently    Partners: Male    Comment: 1st intercourse- 54, partners- 2, divorced  Other Topics Concern  . Not on file  Social  History Narrative  . Not on file   Social Determinants of Health   Financial Resource Strain:   . Difficulty of Paying Living Expenses:   Food Insecurity:   . Worried About Charity fundraiser in the Last Year:   . Arboriculturist in the Last Year:   Transportation Needs:   . Film/video editor (Medical):   Marland Kitchen Lack of Transportation (Non-Medical):   Physical Activity:   . Days of Exercise per Week:   . Minutes of Exercise per Session:   Stress:   . Feeling of Stress :   Social Connections:   . Frequency of Communication with Friends and Family:   . Frequency of Social Gatherings with Friends and Family:   . Attends Religious Services:   . Active Member of Clubs or Organizations:   . Attends Archivist Meetings:   Marland Kitchen Marital Status:   Intimate Partner  Violence:   . Fear of Current or Ex-Partner:   . Emotionally Abused:   Marland Kitchen Physically Abused:   . Sexually Abused:    Review of Systems Appetite is the same--not eating much this weekend (felt food would make it worse) Bowels normal--no blood. No change in color    Objective:   Physical Exam  Constitutional: She appears well-developed. No distress.  Neck: No thyromegaly present.  Cardiovascular: Normal rate, regular rhythm and normal heart sounds. Exam reveals no gallop.  No murmur heard. Respiratory: Effort normal and breath sounds normal. No respiratory distress. She has no wheezes. She has no rales.  GI: Soft. Bowel sounds are normal. She exhibits no distension. There is no abdominal tenderness. There is no rebound and no guarding.  Musculoskeletal:        General: No edema.  Lymphadenopathy:    She has no cervical adenopathy.  Psychiatric: She has a normal mood and affect. Her behavior is normal.           Assessment & Plan:

## 2019-09-30 NOTE — Patient Instructions (Signed)
Please try over the counter omeprazole 20mg  daily on an empty stomach for 2 weeks. Let me know if you have a recurrence of the pain.

## 2020-01-16 ENCOUNTER — Encounter: Payer: Self-pay | Admitting: Obstetrics & Gynecology

## 2020-01-16 ENCOUNTER — Ambulatory Visit (INDEPENDENT_AMBULATORY_CARE_PROVIDER_SITE_OTHER): Payer: Managed Care, Other (non HMO) | Admitting: Obstetrics & Gynecology

## 2020-01-16 ENCOUNTER — Other Ambulatory Visit: Payer: Self-pay

## 2020-01-16 VITALS — BP 136/82 | Ht 65.75 in | Wt 210.0 lb

## 2020-01-16 DIAGNOSIS — Z01419 Encounter for gynecological examination (general) (routine) without abnormal findings: Secondary | ICD-10-CM | POA: Diagnosis not present

## 2020-01-16 DIAGNOSIS — Z780 Asymptomatic menopausal state: Secondary | ICD-10-CM

## 2020-01-16 DIAGNOSIS — E6609 Other obesity due to excess calories: Secondary | ICD-10-CM

## 2020-01-16 DIAGNOSIS — Z6834 Body mass index (BMI) 34.0-34.9, adult: Secondary | ICD-10-CM

## 2020-01-16 DIAGNOSIS — M8589 Other specified disorders of bone density and structure, multiple sites: Secondary | ICD-10-CM | POA: Diagnosis not present

## 2020-01-16 NOTE — Progress Notes (Signed)
Tanya Gomez Feb 26, 1956 [id number]   History:    64 y.o.  G2P2L2 Divorced.  2 children with 4 grand-children.  RP:  Established patient presenting for annual gyn exam   HPI: Menopause, well on no hormone replacement therapy. No postmenopausal bleeding. No pelvic pain. Abstinent. No urgency or stress urinary incontinence. Bowel movements normal. Breasts normal. Body mass index 34.15.  Will decrease calories.  Walking the dog. Health labs with family physician.  Past medical history,surgical history, family history and social history were all reviewed and documented in the EPIC chart.  Gynecologic History No LMP recorded. Patient is postmenopausal.  Obstetric History OB History  Gravida Para Term Preterm AB Living  2 2       2   SAB TAB Ectopic Multiple Live Births               # Outcome Date GA Lbr Len/2nd Weight Sex Delivery Anes PTL Lv  2 Para      Vag-Spont     1 Para      Vag-Spont        ROS: A ROS was performed and pertinent positives and negatives are included in the history.  GENERAL: No fevers or chills. HEENT: No change in vision, no earache, sore throat or sinus congestion. NECK: No pain or stiffness. CARDIOVASCULAR: No chest pain or pressure. No palpitations. PULMONARY: No shortness of breath, cough or wheeze. GASTROINTESTINAL: No abdominal pain, nausea, vomiting or diarrhea, melena or bright red blood per rectum. GENITOURINARY: No urinary frequency, urgency, hesitancy or dysuria. MUSCULOSKELETAL: No joint or muscle pain, no back pain, no recent trauma. DERMATOLOGIC: No rash, no itching, no lesions. ENDOCRINE: No polyuria, polydipsia, no heat or cold intolerance. No recent change in weight. HEMATOLOGICAL: No anemia or easy bruising or bleeding. NEUROLOGIC: No headache, seizures, numbness, tingling or weakness. PSYCHIATRIC: No depression, no loss of interest in normal activity or change in sleep pattern.     Exam:   BP 136/82   Ht 5' 5.75" (1.67 m)   Wt  210 lb (95.3 kg)   BMI 34.15 kg/m   Body mass index is 34.15 kg/m.  General appearance : Well developed well nourished female. No acute distress HEENT: Eyes: no retinal hemorrhage or exudates,  Neck supple, trachea midline, no carotid bruits, no thyroidmegaly Lungs: Clear to auscultation, no rhonchi or wheezes, or rib retractions  Heart: Regular rate and rhythm, no murmurs or gallops Breast:Examined in sitting and supine position were symmetrical in appearance, no palpable masses or tenderness,  no skin retraction, no nipple inversion, no nipple discharge, no skin discoloration, no axillary or supraclavicular lymphadenopathy Abdomen: no palpable masses or tenderness, no rebound or guarding Extremities: no edema or skin discoloration or tenderness  Pelvic: Vulva: Normal             Vagina: No gross lesions or discharge  Cervix: No gross lesions or discharge. Pap reflex done.  Uterus  AV, normal size, shape and consistency, non-tender and mobile  Adnexa  Without masses or tenderness  Anus: Normal   Assessment/Plan:  64 y.o. female for annual exam   1. Encounter for routine gynecological examination with Papanicolaou smear of cervix Normal gynecologic exam in menopause.  Pap reflex done.  Breast exam normal.  Screening mammogram negative in August 2020.  Colonoscopy 2016.  Health labs with family physician.  2. Postmenopause Well on no hormone replacement therapy.  No postmenopausal bleeding.  3. Osteopenia of multiple sites Last bone density August 2019 showed  mild osteopenia.  Decision to repeat her bone density at 3 years next year.  Vitamin D supplements, calcium intake of 1200 to 1500 mg daily and regular weightbearing physical activity is recommended.  4. Class 1 obesity due to excess calories with serious comorbidity and body mass index (BMI) of 34.0 to 34.9 in adult Lower calories/carb diet.  Reduce portions.  Aerobic activities 5 times a week and light weightlifting every 2  days.  Princess Bruins MD, 8:21 AM 01/16/2020

## 2020-01-17 LAB — PAP IG W/ RFLX HPV ASCU

## 2020-02-03 LAB — HM MAMMOGRAPHY

## 2020-02-06 ENCOUNTER — Encounter: Payer: Self-pay | Admitting: Internal Medicine

## 2020-04-01 ENCOUNTER — Encounter: Payer: Self-pay | Admitting: Family Medicine

## 2020-04-01 ENCOUNTER — Ambulatory Visit (INDEPENDENT_AMBULATORY_CARE_PROVIDER_SITE_OTHER): Payer: Managed Care, Other (non HMO) | Admitting: Family Medicine

## 2020-04-01 ENCOUNTER — Other Ambulatory Visit: Payer: Self-pay

## 2020-04-01 VITALS — BP 158/80 | HR 86 | Temp 97.3°F | Ht 65.75 in | Wt 210.4 lb

## 2020-04-01 DIAGNOSIS — I10 Essential (primary) hypertension: Secondary | ICD-10-CM | POA: Diagnosis not present

## 2020-04-01 DIAGNOSIS — J0100 Acute maxillary sinusitis, unspecified: Secondary | ICD-10-CM

## 2020-04-01 MED ORDER — AMOXICILLIN-POT CLAVULANATE 875-125 MG PO TABS: 1.0000 | ORAL_TABLET | Freq: Two times a day (BID) | ORAL | 0 refills | Status: DC

## 2020-04-01 MED ORDER — FLUTICASONE PROPIONATE 50 MCG/ACT NA SUSP: 2.0000 | Freq: Every day | NASAL | 6 refills | Status: AC

## 2020-04-01 NOTE — Patient Instructions (Addendum)
Start the flonase nasal spray as directed through your allergy season  This should help with the ear tube and sinus congestion   Take augmentin for bacterial sinus infection   Try a warm compress around/behind ear and neck as well   Update if not starting to improve in a week or if worsening    Your blood pressure is up today  If you can, monitor at home  Get OMRON with arm cuff size regular  If it remains above 140 top or 90 on bottom -make an appt. With Dr Silvio Pate   Take care of yourself

## 2020-04-01 NOTE — Assessment & Plan Note (Signed)
Pain /pressure in L maxillary area and ear  Some green colored mucous per pt as well  Sent flonase to use through the fall for congestion and ETD augmentin for bacterial infection  Update if not starting to improve in a week or if worsening

## 2020-04-01 NOTE — Progress Notes (Addendum)
Subjective:    Patient ID: Tanya Gomez, female    DOB: August 16, 1955, 64 y.o.   MRN: [id number]  This visit occurred during the SARS-CoV-2 public health emergency.  Safety protocols were in place, including screening questions prior to the visit, additional usage of staff PPE, and extensive cleaning of exam room while observing appropriate contact time as indicated for disinfecting solutions.    HPI 64 yo pt of Dr Silvio Pate presents with ear pai nfor 2 weeks   Wt Readings from Last 3 Encounters:  04/01/20 210 lb 7 oz (95.5 kg)  01/16/20 210 lb (95.3 kg)  09/30/19 207 lb (93.9 kg)   34.22 kg/m   Pain in L ear -radiates behind ear  Some sinus pain under L eye - perhaps a little puffy   The pain is dull/achey  Constant  No drainage  Has not used drops   A little ear wax in the past   Runny nose occ with pnd  Mucous is more green than usual   No cough  No fever  No regular allergy medicine  This is her bad time of year   Ears do not pop R hear has tinnitus-not the L   Took tylenol sinus   bp is markedly elevated on first check today  Takes hctz 12.5 mg daily  BP Readings from Last 3 Encounters:  04/01/20 (!) 172/86  01/16/20 136/82  09/30/19 124/86  re check after sitting BP: (!) 158/80     covid status- vaccinated   Patient Active Problem List   Diagnosis Date Noted  . Epigastric abdominal pain 09/30/2019  . Acute non-recurrent maxillary sinusitis 07/01/2019  . Osteopenia 01/04/2016  . Allergic rhinitis 08/14/2015  . Obesity 04/11/2012  . Routine general medical examination at a health care facility 04/12/2011  . Psoriasis 04/12/2011  . Essential hypertension, benign 10/11/2010  . POSTMENOPAUSAL STATUS 04/29/2009  . TINNITUS 01/21/2009  . Hyperlipemia 10/05/2007  . OSTEOARTHRITIS 10/05/2007  . H/O adenomatous polyp of colon 10/05/2007   Past Medical History:  Diagnosis Date  . History of shingles   . Hx of colonic polyps    adenomatous  .  Hyperlipidemia   . Hypertension   . Osteoarthritis    no per pt 06-17-14  . Osteopenia 01/2018   T score -1.3 FRAX 7.5% / 0.6%   Past Surgical History:  Procedure Laterality Date  . COLONOSCOPY    . PATELLAR TENDON REPAIR  1999   left, spurs removed  . TUBAL LIGATION    . VAGINAL DELIVERY     x2   Social History   Tobacco Use  . Smoking status: Never Smoker  . Smokeless tobacco: Never Used  Vaping Use  . Vaping Use: Never used  Substance Use Topics  . Alcohol use: Yes    Alcohol/week: 0.0 standard drinks    Comment: occasional  . Drug use: No   Family History  Problem Relation Age of Onset  . Diabetes Mother   . Stroke Mother   . Peripheral vascular disease Mother   . Coronary artery disease Father   . Diabetes Father   . Heart disease Father   . Brain cancer Maternal Grandmother   . Cancer Maternal Grandmother        brain  . Hypertension Neg Hx   . Hearing loss Neg Hx   . Colon cancer Neg Hx   . Esophageal cancer Neg Hx   . Stomach cancer Neg Hx   . Rectal cancer Neg  Hx    No Known Allergies Current Outpatient Medications on File Prior to Visit  Medication Sig Dispense Refill  . atorvastatin (LIPITOR) 20 MG tablet TAKE 1 TABLET BY MOUTH DAILY. 90 tablet 3  . hydrochlorothiazide (MICROZIDE) 12.5 MG capsule TAKE 1 CAPSULE BY MOUTH EVERY MORNING 90 capsule 3  . Multiple Vitamin (MULTIVITAMIN) capsule Take 1 capsule by mouth daily.    Marland Kitchen triamcinolone cream (KENALOG) 0.1 % Apply 1 application topically 2 (two) times daily as needed. 45 g 1   No current facility-administered medications on file prior to visit.    Review of Systems  Constitutional: Negative for activity change, appetite change, fatigue, fever and unexpected weight change.  HENT: Positive for ear pain, postnasal drip, sinus pressure and sinus pain. Negative for congestion, ear discharge, facial swelling, rhinorrhea and sore throat.   Eyes: Negative for pain, redness and visual disturbance.    Respiratory: Negative for cough, shortness of breath and wheezing.   Cardiovascular: Negative for chest pain and palpitations.  Gastrointestinal: Negative for abdominal pain, blood in stool, constipation and diarrhea.  Endocrine: Negative for polydipsia and polyuria.  Genitourinary: Negative for dysuria, frequency and urgency.  Musculoskeletal: Positive for neck pain. Negative for arthralgias, back pain and myalgias.  Skin: Negative for pallor and rash.  Allergic/Immunologic: Negative for environmental allergies.  Neurological: Negative for dizziness, syncope and headaches.  Hematological: Negative for adenopathy. Does not bruise/bleed easily.  Psychiatric/Behavioral: Negative for decreased concentration and dysphoric mood. The patient is not nervous/anxious.        Objective:   Physical Exam Constitutional:      General: She is not in acute distress.    Appearance: Normal appearance. She is well-developed. She is obese. She is not ill-appearing, toxic-appearing or diaphoretic.  HENT:     Head: Normocephalic and atraumatic.     Comments: Mild tenderness of L maxillay sinus area  No swelling or skin change    Right Ear: Tympanic membrane, ear canal and external ear normal. There is no impacted cerumen.     Left Ear: Tympanic membrane, ear canal and external ear normal. There is no impacted cerumen.     Ears:     Comments: L TM is dull and slt retracted  R TM clear  No cerumen No external ear or mastoid tenderness    Nose: No congestion or rhinorrhea.     Comments: Boggy nares with deviated septum    Mouth/Throat:     Mouth: Mucous membranes are moist.     Pharynx: Oropharynx is clear. No oropharyngeal exudate or posterior oropharyngeal erythema.     Comments: Clear pnd  Eyes:     General: No scleral icterus.       Right eye: No discharge.        Left eye: No discharge.     Conjunctiva/sclera: Conjunctivae normal.     Pupils: Pupils are equal, round, and reactive to light.   Neck:     Comments: Discomfort with full rotation of neck Cardiovascular:     Rate and Rhythm: Normal rate.     Heart sounds: Normal heart sounds.  Pulmonary:     Effort: Pulmonary effort is normal. No respiratory distress.     Breath sounds: Normal breath sounds. No stridor. No wheezing, rhonchi or rales.  Chest:     Chest wall: No tenderness.  Musculoskeletal:     Cervical back: Normal range of motion and neck supple. No tenderness.     Right lower leg: No edema.  Left lower leg: No edema.  Lymphadenopathy:     Cervical: No cervical adenopathy.  Skin:    General: Skin is warm and dry.     Capillary Refill: Capillary refill takes less than 2 seconds.     Findings: No rash.  Neurological:     Mental Status: She is alert.     Cranial Nerves: No cranial nerve deficit.  Psychiatric:        Mood and Affect: Mood normal.           Assessment & Plan:   Problem List Items Addressed This Visit      Cardiovascular and Mediastinum   Essential hypertension, benign    BP is elevated today-per pt possibly due to nervousness and rush to get here  She takes hctz 12.5 mg daily  Urged to re check at home-disc what bp cuff to invest in and how to use (handout given)  Disc goal of bp under 140/90  She will f/u with pcp if bp remains elevated at home        Respiratory   Acute non-recurrent maxillary sinusitis - Primary    Pain /pressure in L maxillary area and ear  Some green colored mucous per pt as well  Sent flonase to use through the fall for congestion and ETD augmentin for bacterial infection  Update if not starting to improve in a week or if worsening        Relevant Medications   amoxicillin-clavulanate (AUGMENTIN) 875-125 MG tablet   fluticasone (FLONASE) 50 MCG/ACT nasal spray

## 2020-04-01 NOTE — Assessment & Plan Note (Signed)
BP is elevated today-per pt possibly due to nervousness and rush to get here  She takes hctz 12.5 mg daily  Urged to re check at home-disc what bp cuff to invest in and how to use (handout given)  Disc goal of bp under 140/90  She will f/u with pcp if bp remains elevated at home

## 2020-04-20 ENCOUNTER — Other Ambulatory Visit: Payer: Self-pay | Admitting: Internal Medicine

## 2020-05-14 ENCOUNTER — Ambulatory Visit (INDEPENDENT_AMBULATORY_CARE_PROVIDER_SITE_OTHER): Payer: Managed Care, Other (non HMO)

## 2020-05-14 DIAGNOSIS — Z23 Encounter for immunization: Secondary | ICD-10-CM | POA: Diagnosis not present

## 2020-05-22 ENCOUNTER — Encounter: Payer: Managed Care, Other (non HMO) | Admitting: Internal Medicine

## 2020-06-26 ENCOUNTER — Ambulatory Visit (INDEPENDENT_AMBULATORY_CARE_PROVIDER_SITE_OTHER): Payer: Managed Care, Other (non HMO) | Admitting: Obstetrics & Gynecology

## 2020-06-26 ENCOUNTER — Other Ambulatory Visit: Payer: Self-pay

## 2020-06-26 ENCOUNTER — Encounter: Payer: Self-pay | Admitting: Obstetrics & Gynecology

## 2020-06-26 VITALS — BP 142/90

## 2020-06-26 DIAGNOSIS — D1801 Hemangioma of skin and subcutaneous tissue: Secondary | ICD-10-CM | POA: Diagnosis not present

## 2020-06-26 DIAGNOSIS — L738 Other specified follicular disorders: Secondary | ICD-10-CM

## 2020-06-26 NOTE — Progress Notes (Signed)
    Tanya Gomez 25-May-1956 [id number]        65 y.o.  G2P2L2 Divorced  RP: Rt vulvar tender bump at the fold and Rt vulvar blood blisters  HPI: Started exercising on the [employer] and noticed a tender bump at the Rt vulvar/thigh fold.  As she looked with the mirror, she noticed the small blood blisters at the Rt vulva.   OB History  Gravida Para Term Preterm AB Living  2 2       2   SAB IAB Ectopic Multiple Live Births               # Outcome Date GA Lbr Len/2nd Weight Sex Delivery Anes PTL Lv  2 Para      Vag-Spont     1 Para      Vag-Spont       Past medical history,surgical history, problem list, medications, allergies, family history and social history were all reviewed and documented in the EPIC chart.   Directed ROS with pertinent positives and negatives documented in the history of present illness/assessment and plan.  Exam:  Vitals:   06/26/20 1429  BP: (!) 142/90   General appearance:  Normal  Gynecologic exam: Vulva:  Right with 3 small hemangiomas.  Right intertriginous fold with an infected small Sebaceous gland.   Assessment/Plan:  65 y.o. G2P2   1. Infected sebaceous gland Rt infected sebaceous gland.  Warm soaks recommended.  Loose clothing.  May apply Neosporin ointment as needed.  2. Hemangioma of skin and subcutaneous tissue  3 small hemangiomas at the Rt vulva.  Patient reassured.  Princess Bruins MD, 2:45 PM 06/26/2020

## 2020-07-16 ENCOUNTER — Other Ambulatory Visit: Payer: Self-pay

## 2020-07-17 ENCOUNTER — Other Ambulatory Visit: Payer: Self-pay

## 2020-07-17 ENCOUNTER — Ambulatory Visit (INDEPENDENT_AMBULATORY_CARE_PROVIDER_SITE_OTHER): Payer: Managed Care, Other (non HMO) | Admitting: Internal Medicine

## 2020-07-17 ENCOUNTER — Encounter: Payer: Self-pay | Admitting: Internal Medicine

## 2020-07-17 VITALS — BP 104/76 | HR 90 | Temp 97.5°F | Ht 66.25 in | Wt 210.0 lb

## 2020-07-17 DIAGNOSIS — Z0000 Encounter for general adult medical examination without abnormal findings: Secondary | ICD-10-CM

## 2020-07-17 DIAGNOSIS — E785 Hyperlipidemia, unspecified: Secondary | ICD-10-CM | POA: Diagnosis not present

## 2020-07-17 DIAGNOSIS — I10 Essential (primary) hypertension: Secondary | ICD-10-CM

## 2020-07-17 LAB — COMPREHENSIVE METABOLIC PANEL
ALT: 17 U/L (ref 0–35)
AST: 16 U/L (ref 0–37)
Albumin: 4.5 g/dL (ref 3.5–5.2)
Alkaline Phosphatase: 70 U/L (ref 39–117)
BUN: 14 mg/dL (ref 6–23)
CO2: 30 mEq/L (ref 19–32)
Calcium: 10 mg/dL (ref 8.4–10.5)
Chloride: 101 mEq/L (ref 96–112)
Creatinine, Ser: 0.67 mg/dL (ref 0.40–1.20)
GFR: 92.14 mL/min (ref >60.00)
Glucose, Bld: 87 mg/dL (ref 70–99)
Potassium: 3.7 mEq/L (ref 3.5–5.1)
Sodium: 137 mEq/L (ref 135–145)
Total Bilirubin: 0.6 mg/dL (ref 0.2–1.2)
Total Protein: 7.7 g/dL (ref 6.0–8.3)

## 2020-07-17 LAB — LIPID PANEL
Cholesterol: 170 mg/dL (ref 0–200)
HDL: 63.6 mg/dL (ref >39.00)
LDL Cholesterol: 85 mg/dL (ref 0–99)
NonHDL: 106.03
Total CHOL/HDL Ratio: 3
Triglycerides: 104 mg/dL (ref 0.0–149.0)
VLDL: 20.8 mg/dL (ref 0.0–40.0)

## 2020-07-17 LAB — CBC
HCT: 43.5 % (ref 36.0–46.0)
Hemoglobin: 14.6 g/dL (ref 12.0–15.0)
MCHC: 33.7 g/dL (ref 30.0–36.0)
MCV: 91.1 fl (ref 78.0–100.0)
Platelets: 294 10*3/uL (ref 150.0–400.0)
RBC: 4.77 Mil/uL (ref 3.87–5.11)
RDW: 12.3 % (ref 11.5–15.5)
WBC: 7.9 10*3/uL (ref 4.0–10.5)

## 2020-07-17 NOTE — Assessment & Plan Note (Signed)
Healthy Discussed fitness Mammogram every 1-2 years--last in August Colon due 2023 Recent pap--not sure she will need more COVID booster in March

## 2020-07-17 NOTE — Progress Notes (Signed)
Subjective:    Patient ID: Tanya Gomez, female    DOB: [birth year], 65 y.o.   MRN: [id number]  HPI Here for physical This visit occurred during the SARS-CoV-2 public health emergency.  Safety protocols were in place, including screening questions prior to the visit, additional usage of staff PPE, and extensive cleaning of exam room while observing appropriate contact time as indicated for disinfecting solutions.   Still has some left ear pain Serous otitis media Some better with flonase  Current Outpatient Medications on File Prior to Visit  Medication Sig Dispense Refill  . atorvastatin (LIPITOR) 20 MG tablet TAKE 1 TABLET BY MOUTH DAILY. 90 tablet 3  . fluticasone (FLONASE) 50 MCG/ACT nasal spray Place 2 sprays into both nostrils daily. 16 g 6  . hydrochlorothiazide (MICROZIDE) 12.5 MG capsule TAKE 1 CAPSULE BY MOUTH EVERY MORNING 90 capsule 3  . Multiple Vitamin (MULTIVITAMIN) capsule Take 1 capsule by mouth daily.    Marland Kitchen triamcinolone cream (KENALOG) 0.1 % APPLY 1 APPLICATION TOPICALLY 2 (TWO) TIMES DAILY AS NEEDED. 45 g 1   No current facility-administered medications on file prior to visit.    No Known Allergies  Past Medical History:  Diagnosis Date  . History of shingles   . Hx of colonic polyps    adenomatous  . Hyperlipidemia   . Hypertension   . Osteoarthritis    no per pt 06-17-14  . Osteopenia 01/2018   T score -1.3 FRAX 7.5% / 0.6%    Past Surgical History:  Procedure Laterality Date  . COLONOSCOPY    . PATELLAR TENDON REPAIR  1999   left, spurs removed  . TUBAL LIGATION    . VAGINAL DELIVERY     x2    Family History  Problem Relation Age of Onset  . Diabetes Mother   . Stroke Mother   . Peripheral vascular disease Mother   . Coronary artery disease Father   . Diabetes Father   . Heart disease Father   . Brain cancer Maternal Grandmother   . Cancer Maternal Grandmother        brain  . Hypertension Neg Hx   . Hearing loss Neg Hx   . Colon  cancer Neg Hx   . Esophageal cancer Neg Hx   . Stomach cancer Neg Hx   . Rectal cancer Neg Hx     Social History   Socioeconomic History  . Marital status: Divorced    Spouse name: Not on file  . Number of children: 2  . Years of education: Not on file  . Highest education level: Not on file  Occupational History  . Occupation: [email or web address]  Tobacco Use  . Smoking status: Never Smoker  . Smokeless tobacco: Never Used  Vaping Use  . Vaping Use: Never used  Substance and Sexual Activity  . Alcohol use: Yes    Alcohol/week: 0.0 standard drinks    Comment: occasional  . Drug use: No  . Sexual activity: Not Currently    Partners: Male    Comment: 1st intercourse- 21, partners- 2, divorced  Other Topics Concern  . Not on file  Social History Narrative  . Not on file   Social Determinants of Health   Financial Resource Strain: Not on file  Food Insecurity: Not on file  Transportation Needs: Not on file  Physical Activity: Not on file  Stress: Not on file  Social Connections: Not on file  Intimate Partner Violence: Not on file  Review of Systems  Constitutional: Negative for unexpected weight change.       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       No diplopia or unilateral vision loss  Respiratory: Negative for cough, chest tightness and shortness of breath.   Cardiovascular: Negative for chest pain, palpitations and leg swelling.  Gastrointestinal: Negative for blood in stool and constipation.       No heartburn  Endocrine: Negative for polydipsia and polyuria.  Genitourinary: Negative for dysuria and hematuria.  Musculoskeletal: Negative for back pain and joint swelling.       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 Negative for immunocompromised state.  Neurological: Negative for dizziness, syncope, light-headedness and headaches.  Hematological: Negative for adenopathy. Does not bruise/bleed easily.  Psychiatric/Behavioral: Negative for dysphoric mood and sleep disturbance. The patient is not nervous/anxious.        Objective:   Physical Exam Constitutional:      Appearance: Normal appearance.  HENT:     Right Ear: Tympanic membrane, ear canal and external ear normal.     Left Ear: Tympanic membrane, ear canal and external ear normal.     Mouth/Throat:     Pharynx: No oropharyngeal exudate or posterior oropharyngeal erythema.  Eyes:     Conjunctiva/sclera: Conjunctivae normal.     Pupils: Pupils are equal, round, and reactive to light.  Cardiovascular:     Rate and Rhythm: Normal rate and regular rhythm.     Pulses: Normal pulses.     Heart sounds: No murmur heard. No gallop.   Pulmonary:     Effort: Pulmonary effort is normal.     Breath sounds: Normal breath sounds. No wheezing or rales.  Abdominal:     Palpations: Abdomen is soft.     Tenderness: There is no abdominal tenderness.  Musculoskeletal:     Cervical back: Neck supple.     Right lower leg: No edema.     Left lower leg: No edema.  Lymphadenopathy:     Cervical: No cervical adenopathy.  Skin:    General: Skin is warm.     Findings: No rash.  Neurological:     General: No focal deficit present.     Mental Status: She is alert and oriented to person, place, and time.  Psychiatric:        Mood and Affect: Mood normal.        Behavior: Behavior normal.            Assessment & Plan:

## 2020-07-17 NOTE — Assessment & Plan Note (Signed)
BP Readings from Last 3 Encounters:  07/17/20 104/76  06/26/20 (!) 142/90  04/01/20 (!) 158/80   Good control on HCTZ

## 2020-07-17 NOTE — Assessment & Plan Note (Signed)
No problems with statin 

## 2020-09-02 ENCOUNTER — Other Ambulatory Visit: Payer: Self-pay | Admitting: Internal Medicine

## 2021-01-19 ENCOUNTER — Ambulatory Visit (INDEPENDENT_AMBULATORY_CARE_PROVIDER_SITE_OTHER): Payer: Managed Care, Other (non HMO) | Admitting: Obstetrics & Gynecology

## 2021-01-19 ENCOUNTER — Other Ambulatory Visit: Payer: Self-pay

## 2021-01-19 ENCOUNTER — Encounter: Payer: Self-pay | Admitting: Obstetrics & Gynecology

## 2021-01-19 VITALS — BP 124/80 | HR 81 | Resp 16 | Ht 66.25 in | Wt 212.0 lb

## 2021-01-19 DIAGNOSIS — Z01419 Encounter for gynecological examination (general) (routine) without abnormal findings: Secondary | ICD-10-CM | POA: Diagnosis not present

## 2021-01-19 DIAGNOSIS — E6609 Other obesity due to excess calories: Secondary | ICD-10-CM

## 2021-01-19 DIAGNOSIS — Z780 Asymptomatic menopausal state: Secondary | ICD-10-CM

## 2021-01-19 DIAGNOSIS — M8589 Other specified disorders of bone density and structure, multiple sites: Secondary | ICD-10-CM

## 2021-01-19 DIAGNOSIS — Z6833 Body mass index (BMI) 33.0-33.9, adult: Secondary | ICD-10-CM

## 2021-01-19 NOTE — Progress Notes (Signed)
Tanya Gomez 18-Jan-1956 [id number]   History:    65 y.o. G2P2L2 Divorced.  2 children with 4 grand-children.   RP:  Established patient presenting for annual gyn exam   HPI: Postmenopause, well on no hormone replacement therapy.  No postmenopausal bleeding.  No pelvic pain.  Abstinent.  No urgency or stress urinary incontinence.  Bowel movements normal. Breasts normal.  Body mass index 33.96.  Will decrease calories.  Walking the dog.  Health labs with family physician.  Colono 2016.  BD 01/2018 Osteopenia Rt Fem Neck T-Score -1.3.   Past medical history,surgical history, family history and social history were all reviewed and documented in the EPIC chart.  Gynecologic History No LMP recorded. Patient is postmenopausal.  Obstetric History OB History  Gravida Para Term Preterm AB Living  '2 2       2  '$ SAB IAB Ectopic Multiple Live Births               # Outcome Date GA Lbr Len/2nd Weight Sex Delivery Anes PTL Lv  2 Para      Vag-Spont     1 Para      Vag-Spont        ROS: A ROS was performed and pertinent positives and negatives are included in the history.  GENERAL: No fevers or chills. HEENT: No change in vision, no earache, sore throat or sinus congestion. NECK: No pain or stiffness. CARDIOVASCULAR: No chest pain or pressure. No palpitations. PULMONARY: No shortness of breath, cough or wheeze. GASTROINTESTINAL: No abdominal pain, nausea, vomiting or diarrhea, melena or bright red blood per rectum. GENITOURINARY: No urinary frequency, urgency, hesitancy or dysuria. MUSCULOSKELETAL: No joint or muscle pain, no back pain, no recent trauma. DERMATOLOGIC: No rash, no itching, no lesions. ENDOCRINE: No polyuria, polydipsia, no heat or cold intolerance. No recent change in weight. HEMATOLOGICAL: No anemia or easy bruising or bleeding. NEUROLOGIC: No headache, seizures, numbness, tingling or weakness. PSYCHIATRIC: No depression, no loss of interest in normal activity or change in sleep  pattern.     Exam:   BP 124/80   Pulse 81   Resp 16   Ht 5' 6.25" (1.683 m)   Wt 212 lb (96.2 kg)   BMI 33.96 kg/m   Body mass index is 33.96 kg/m.  General appearance : Well developed well nourished female. No acute distress HEENT: Eyes: no retinal hemorrhage or exudates,  Neck supple, trachea midline, no carotid bruits, no thyroidmegaly Lungs: Clear to auscultation, no rhonchi or wheezes, or rib retractions  Heart: Regular rate and rhythm, no murmurs or gallops Breast:Examined in sitting and supine position were symmetrical in appearance, no palpable masses or tenderness,  no skin retraction, no nipple inversion, no nipple discharge, no skin discoloration, no axillary or supraclavicular lymphadenopathy Abdomen: no palpable masses or tenderness, no rebound or guarding Extremities: no edema or skin discoloration or tenderness  Pelvic: Vulva: Normal             Vagina: No gross lesions or discharge  Cervix: No gross lesions or discharge  Uterus  AV, normal size, shape and consistency, non-tender and mobile  Adnexa  Without masses or tenderness  Anus: Normal   Assessment/Plan:  65 y.o. female for annual exam   1. Well female exam with routine gynecological exam Normal gynecologic exam.  Pap test negative in 2021, no indication to repeat at this time.  Breast exam normal.  We will schedule a screening mammogram now.  Colonoscopy in 2016.  Health labs with family physician.  2. Postmenopause Well on no hormone replacement therapy.  No postmenopausal bleeding.  3. Osteopenia of multiple sites Will schedule a bone density here now.  Vitamin D supplements, calcium intake of 1.5 g/day total and regular weightbearing physical activity is recommended - DG Bone Density; Future  4. Class 1 obesity due to excess calories with serious comorbidity and body mass index (BMI) of 33.0 to 33.9 in adult  Recommend a lower calorie/carb diet.  Aerobic activities 5 times a week and light  weightlifting every 2 days.  Princess Bruins MD, 8:21 AM 01/19/2021

## 2021-03-02 ENCOUNTER — Other Ambulatory Visit: Payer: Self-pay | Admitting: Obstetrics & Gynecology

## 2021-03-02 ENCOUNTER — Other Ambulatory Visit: Payer: Self-pay

## 2021-03-02 ENCOUNTER — Encounter (INDEPENDENT_AMBULATORY_CARE_PROVIDER_SITE_OTHER): Payer: Managed Care, Other (non HMO)

## 2021-03-02 DIAGNOSIS — M85851 Other specified disorders of bone density and structure, right thigh: Secondary | ICD-10-CM

## 2021-03-02 DIAGNOSIS — Z780 Asymptomatic menopausal state: Secondary | ICD-10-CM

## 2021-03-02 DIAGNOSIS — M8589 Other specified disorders of bone density and structure, multiple sites: Secondary | ICD-10-CM

## 2021-04-16 ENCOUNTER — Ambulatory Visit (INDEPENDENT_AMBULATORY_CARE_PROVIDER_SITE_OTHER): Payer: Managed Care, Other (non HMO)

## 2021-04-16 ENCOUNTER — Other Ambulatory Visit: Payer: Self-pay

## 2021-04-16 DIAGNOSIS — Z23 Encounter for immunization: Secondary | ICD-10-CM | POA: Diagnosis not present

## 2021-04-29 ENCOUNTER — Encounter: Payer: Self-pay | Admitting: Internal Medicine

## 2021-06-13 HISTORY — PX: TOTAL KNEE ARTHROPLASTY: SHX125

## 2021-06-13 HISTORY — PX: REPLACEMENT TOTAL KNEE BILATERAL: SUR1225

## 2021-06-16 ENCOUNTER — Encounter: Payer: Self-pay | Admitting: Internal Medicine

## 2021-06-16 ENCOUNTER — Other Ambulatory Visit: Payer: Self-pay

## 2021-06-16 ENCOUNTER — Ambulatory Visit (INDEPENDENT_AMBULATORY_CARE_PROVIDER_SITE_OTHER): Payer: Managed Care, Other (non HMO) | Admitting: Internal Medicine

## 2021-06-16 VITALS — BP 138/84 | HR 95 | Temp 97.5°F | Ht 65.25 in | Wt 214.0 lb

## 2021-06-16 DIAGNOSIS — Z01818 Encounter for other preprocedural examination: Secondary | ICD-10-CM | POA: Diagnosis not present

## 2021-06-16 LAB — PROTIME-INR
INR: 1 ratio (ref 0.8–1.0)
Prothrombin Time: 11 s (ref 9.6–13.1)

## 2021-06-16 LAB — CBC WITH DIFFERENTIAL/PLATELET
Basophils Absolute: 0 10*3/uL (ref 0.0–0.1)
Basophils Relative: 0.5 % (ref 0.0–3.0)
Eosinophils Absolute: 0.1 10*3/uL (ref 0.0–0.7)
Eosinophils Relative: 1.7 % (ref 0.0–5.0)
HCT: 41.5 % (ref 36.0–46.0)
Hemoglobin: 14.1 g/dL (ref 12.0–15.0)
Lymphocytes Relative: 34.6 % (ref 12.0–46.0)
Lymphs Abs: 2.6 10*3/uL (ref 0.7–4.0)
MCHC: 33.9 g/dL (ref 30.0–36.0)
MCV: 92.1 fl (ref 78.0–100.0)
Monocytes Absolute: 0.6 10*3/uL (ref 0.1–1.0)
Monocytes Relative: 8 % (ref 3.0–12.0)
Neutro Abs: 4.1 10*3/uL (ref 1.4–7.7)
Neutrophils Relative %: 55.2 % (ref 43.0–77.0)
Platelets: 261 10*3/uL (ref 150.0–400.0)
RBC: 4.51 Mil/uL (ref 3.87–5.11)
RDW: 12.1 % (ref 11.5–15.5)
WBC: 7.5 10*3/uL (ref 4.0–10.5)

## 2021-06-16 LAB — RENAL FUNCTION PANEL
Albumin: 4.1 g/dL (ref 3.5–5.2)
BUN: 16 mg/dL (ref 6–23)
CO2: 28 mEq/L (ref 19–32)
Calcium: 9.3 mg/dL (ref 8.4–10.5)
Chloride: 103 mEq/L (ref 96–112)
Creatinine, Ser: 0.66 mg/dL (ref 0.40–1.20)
GFR: 91.88 mL/min (ref >60.00)
Glucose, Bld: 90 mg/dL (ref 70–99)
Phosphorus: 3 mg/dL (ref 2.3–4.6)
Potassium: 3.9 mEq/L (ref 3.5–5.1)
Sodium: 138 mEq/L (ref 135–145)

## 2021-06-16 NOTE — Progress Notes (Signed)
Subjective:    Patient ID: Tanya Gomez, female    DOB: [birth year], 66 y.o.   MRN: [id number]  HPI Here for surgical clearance prior to right TKR by Dr Mayer Camel  Has had progressive worsening of right knee pain Told "bone on bone" Has opted for TKR  No chest pain No trouble breathing  No edema Sleeps flat and no PND No easier DOE  No abnormal bleeding No anaesthesia reactions No history of blood clots  Current Outpatient Medications on File Prior to Visit  Medication Sig Dispense Refill   atorvastatin (LIPITOR) 20 MG tablet TAKE 1 TABLET BY MOUTH DAILY. 90 tablet 3   fluticasone (FLONASE) 50 MCG/ACT nasal spray Place 2 sprays into both nostrils daily. 16 g 6   hydrochlorothiazide (MICROZIDE) 12.5 MG capsule TAKE 1 CAPSULE BY MOUTH EVERY MORNING 90 capsule 3   Multiple Vitamin (MULTIVITAMIN) capsule Take 1 capsule by mouth daily.     triamcinolone cream (KENALOG) 0.1 % APPLY 1 APPLICATION TOPICALLY 2 (TWO) TIMES DAILY AS NEEDED. 45 g 1   No current facility-administered medications on file prior to visit.    No Known Allergies  Past Medical History:  Diagnosis Date   History of shingles    Hx of colonic polyps    adenomatous   Hyperlipidemia    Hypertension    Osteoarthritis    in both knees   Osteopenia 01/2018   T score -1.3 FRAX 7.5% / 0.6%    Past Surgical History:  Procedure Laterality Date   COLONOSCOPY     PATELLAR TENDON REPAIR  1999   left, spurs removed   TUBAL LIGATION     VAGINAL DELIVERY     x2    Family History  Problem Relation Age of Onset   Diabetes Mother    Stroke Mother    Peripheral vascular disease Mother    Coronary artery disease Father    Diabetes Father    Heart disease Father    Brain cancer Maternal Grandmother    Cancer Maternal Grandmother        brain   Hypertension Neg Hx    Hearing loss Neg Hx    Colon cancer Neg Hx    Esophageal cancer Neg Hx    Stomach cancer Neg Hx    Rectal cancer Neg Hx     Social  History   Socioeconomic History   Marital status: Divorced    Spouse name: Not on file   Number of children: 2   Years of education: Not on file   Highest education level: Not on file  Occupational History   Occupation: [email or web address]  Tobacco Use   Smoking status: Never   Smokeless tobacco: Never  Vaping Use   Vaping Use: Never used  Substance and Sexual Activity   Alcohol use: Yes    Alcohol/week: 0.0 standard drinks    Comment: occasional   Drug use: No   Sexual activity: Not Currently    Partners: Male    Birth control/protection: Post-menopausal    Comment: 1st intercourse- 26, partners- 2, divorced  Other Topics Concern   Not on file  Social History Narrative   Not on file   Social Determinants of Health   Financial Resource Strain: Not on file  Food Insecurity: Not on file  Transportation Needs: Not on file  Physical Activity: Not on file  Stress: Not on file  Social Connections: Not on file  Intimate Partner Violence: Not on file  Review of Systems No problems with appetite Weight is up slightly Sleeps okay Daughter will stay with her for a while after surgery    Objective:   Physical Exam Constitutional:      Appearance: Normal appearance.  Cardiovascular:     Rate and Rhythm: Normal rate and regular rhythm.     Pulses: Normal pulses.     Heart sounds: No murmur heard.   No gallop.  Pulmonary:     Effort: Pulmonary effort is normal.     Breath sounds: Normal breath sounds. No wheezing or rales.  Abdominal:     Palpations: Abdomen is soft.     Tenderness: There is no abdominal tenderness.  Musculoskeletal:     Cervical back: Neck supple.     Right lower leg: No edema.     Left lower leg: No edema.     Comments: Significant right knee thickening with valgus deformity  Lymphadenopathy:     Cervical: No cervical adenopathy.  Neurological:     Mental Status: She is alert.  Psychiatric:        Mood and Affect: Mood  normal.        Behavior: Behavior normal.           Assessment & Plan:

## 2021-06-16 NOTE — Assessment & Plan Note (Addendum)
Substantial morbidity with her knee Low risk cardiovascular profile No history of thrombosis or abnormal bleeding EKG----sinus at 82. Possible right atrial enlargement. Non specific anterior T wave changes. No changes since 10/04/07  Okay to proceed with surgery Usual oral or lovenox. DVT prophylaxis

## 2021-07-08 ENCOUNTER — Encounter: Payer: Self-pay | Admitting: Gastroenterology

## 2021-07-23 ENCOUNTER — Encounter: Payer: Managed Care, Other (non HMO) | Admitting: Internal Medicine

## 2021-07-28 ENCOUNTER — Other Ambulatory Visit: Payer: Self-pay

## 2021-07-28 ENCOUNTER — Ambulatory Visit (INDEPENDENT_AMBULATORY_CARE_PROVIDER_SITE_OTHER): Payer: Managed Care, Other (non HMO) | Admitting: Internal Medicine

## 2021-07-28 ENCOUNTER — Encounter: Payer: Self-pay | Admitting: Internal Medicine

## 2021-07-28 VITALS — BP 140/82 | HR 74 | Temp 97.0°F | Ht 66.0 in | Wt 213.0 lb

## 2021-07-28 DIAGNOSIS — E785 Hyperlipidemia, unspecified: Secondary | ICD-10-CM

## 2021-07-28 DIAGNOSIS — Z0000 Encounter for general adult medical examination without abnormal findings: Secondary | ICD-10-CM

## 2021-07-28 DIAGNOSIS — I10 Essential (primary) hypertension: Secondary | ICD-10-CM

## 2021-07-28 DIAGNOSIS — Z23 Encounter for immunization: Secondary | ICD-10-CM

## 2021-07-28 MED ORDER — TRIAMCINOLONE ACETONIDE 0.1 % EX CREA: 1.0000 "application " | TOPICAL_CREAM | Freq: Two times a day (BID) | CUTANEOUS | 1 refills | Status: DC | PRN

## 2021-07-28 NOTE — Assessment & Plan Note (Signed)
BP Readings from Last 3 Encounters:  07/28/21 140/82  06/16/21 138/84  01/19/21 124/80   Controlled on HCTZ 12.5mg  daily

## 2021-07-28 NOTE — Addendum Note (Signed)
Addended by: Pilar Grammes on: 07/28/2021 11:11 AM   Modules accepted: Orders

## 2021-07-28 NOTE — Progress Notes (Addendum)
Subjective:    Patient ID: Tanya Gomez, female    DOB: 1955/10/28, 66 y.o.   MRN: [id number]  HPI Here for physical  Did well with right TKR Now walking with cane--hopes to get rid of that soon PT twice a week for now Working from home--still full time  Same meds No problems  Current Outpatient Medications on File Prior to Visit  Medication Sig Dispense Refill   atorvastatin (LIPITOR) 20 MG tablet TAKE 1 TABLET BY MOUTH DAILY. 90 tablet 3   fluticasone (FLONASE) 50 MCG/ACT nasal spray Place 2 sprays into both nostrils daily. 16 g 6   hydrochlorothiazide (MICROZIDE) 12.5 MG capsule TAKE 1 CAPSULE BY MOUTH EVERY MORNING 90 capsule 3   Multiple Vitamin (MULTIVITAMIN) capsule Take 1 capsule by mouth daily.     triamcinolone cream (KENALOG) 0.1 % APPLY 1 APPLICATION TOPICALLY 2 (TWO) TIMES DAILY AS NEEDED. 45 g 1   No current facility-administered medications on file prior to visit.    No Known Allergies  Past Medical History:  Diagnosis Date   History of shingles    Hx of colonic polyps    adenomatous   Hyperlipidemia    Hypertension    Osteoarthritis    in both knees   Osteopenia 01/2018   T score -1.3 FRAX 7.5% / 0.6%    Past Surgical History:  Procedure Laterality Date   COLONOSCOPY     PATELLAR TENDON REPAIR  06/13/1997   left, spurs removed   TOTAL KNEE ARTHROPLASTY Right 06/2021   TUBAL LIGATION     VAGINAL DELIVERY     x2    Family History  Problem Relation Age of Onset   Diabetes Mother    Stroke Mother    Peripheral vascular disease Mother    Coronary artery disease Father    Diabetes Father    Heart disease Father    Brain cancer Maternal Grandmother    Cancer Maternal Grandmother        brain   Hypertension Neg Hx    Hearing loss Neg Hx    Colon cancer Neg Hx    Esophageal cancer Neg Hx    Stomach cancer Neg Hx    Rectal cancer Neg Hx     Social History   Socioeconomic History   Marital status: Divorced    Spouse name: Not on  file   Number of children: 2   Years of education: Not on file   Highest education level: Not on file  Occupational History   Occupation: [email or web address]  Tobacco Use   Smoking status: Never    Passive exposure: Past (Parents were smokers)   Smokeless tobacco: Never  Vaping Use   Vaping Use: Never used  Substance and Sexual Activity   Alcohol use: Yes    Alcohol/week: 0.0 standard drinks    Comment: occasional   Drug use: No   Sexual activity: Not Currently    Partners: Male    Birth control/protection: Post-menopausal    Comment: 1st intercourse- 74, partners- 2, divorced  Other Topics Concern   Not on file  Social History Narrative   Not on file   Social Determinants of Health   Financial Resource Strain: Not on file  Food Insecurity: Not on file  Transportation Needs: Not on file  Physical Activity: Not on file  Stress: Not on file  Social Connections: Not on file  Intimate Partner Violence: Not on file   Review of Systems  Constitutional:  Negative for fatigue and unexpected weight change.       Wears seat belt  HENT:  Negative for dental problem and tinnitus.        Keeps up with dentist Deaf in right ear  Eyes:  Negative for visual disturbance.       No diplopia or unilateral vision loss  Respiratory:  Negative for cough, chest tightness and shortness of breath.   Cardiovascular:  Negative for chest pain and palpitations.       Mild right foot swelling since TKR  Gastrointestinal:  Negative for blood in stool and constipation.       No heartburn  Endocrine: Negative for polydipsia and polyuria.  Genitourinary:  Negative for difficulty urinating, dyspareunia, dysuria and hematuria.  Musculoskeletal:  Negative for back pain and joint swelling.       Will need left TKR some time  Skin:  Negative for rash.  Allergic/Immunologic: Positive for environmental allergies. Negative for immunocompromised state.       Will occasionally use OTC  allergy pill  Neurological:  Negative for dizziness, syncope, light-headedness and headaches.  Hematological:  Negative for adenopathy. Does not bruise/bleed easily.  Psychiatric/Behavioral:  Negative for dysphoric mood and sleep disturbance. The patient is not nervous/anxious.       Objective:   Physical Exam Constitutional:      Appearance: Normal appearance.  HENT:     Mouth/Throat:     Pharynx: No oropharyngeal exudate or posterior oropharyngeal erythema.  Eyes:     Conjunctiva/sclera: Conjunctivae normal.     Pupils: Pupils are equal, round, and reactive to light.  Cardiovascular:     Rate and Rhythm: Normal rate and regular rhythm.     Pulses: Normal pulses.     Heart sounds:    No gallop.     Comments: Soft systolic murmur along left sternal border Pulmonary:     Effort: Pulmonary effort is normal.     Breath sounds: Normal breath sounds. No wheezing or rales.  Abdominal:     Palpations: Abdomen is soft.     Tenderness: There is no abdominal tenderness.  Musculoskeletal:     Cervical back: Neck supple.     Right lower leg: No edema.     Left lower leg: No edema.  Lymphadenopathy:     Cervical: No cervical adenopathy.  Skin:    Findings: No lesion or rash.  Neurological:     General: No focal deficit present.     Mental Status: She is alert and oriented to person, place, and time.  Psychiatric:        Mood and Affect: Mood normal.        Behavior: Behavior normal.           Assessment & Plan:

## 2021-07-28 NOTE — Assessment & Plan Note (Signed)
Continues on atorvastatin 20 daily for primary prevention

## 2021-07-28 NOTE — Assessment & Plan Note (Signed)
Healthy Recovering from TKR--and hopefully can increase exercise Colon due now---she got letter and will set up Recent mammogram--due again in 1-2 years Should get one last pap at gyn Prevnar 20 today Flu vaccine in the fall Considering COVID booster

## 2021-08-25 ENCOUNTER — Other Ambulatory Visit: Payer: Self-pay | Admitting: Internal Medicine

## 2021-10-15 ENCOUNTER — Encounter: Payer: Self-pay | Admitting: Internal Medicine

## 2021-10-15 ENCOUNTER — Ambulatory Visit (INDEPENDENT_AMBULATORY_CARE_PROVIDER_SITE_OTHER): Payer: Managed Care, Other (non HMO) | Admitting: Internal Medicine

## 2021-10-15 VITALS — BP 138/86 | HR 90 | Temp 97.1°F | Ht 66.0 in | Wt 215.0 lb

## 2021-10-15 DIAGNOSIS — Z01818 Encounter for other preprocedural examination: Secondary | ICD-10-CM | POA: Diagnosis not present

## 2021-10-15 NOTE — Assessment & Plan Note (Addendum)
Healthy ?Did well with first TKR--now left to be done 5/19 ?Used ASA for DVT prophylaxis----okay since done as outpatient and walking immediately ?No cardiorespiratory concerns ?Okay to proceed with left TKR---low risk ? ?Labs to Dr Mayer Camel ?

## 2021-10-15 NOTE — Progress Notes (Signed)
? ?Subjective:  ? ? Patient ID: Somer Trotter Poteat, female    DOB: 1955/08/20, 66 y.o.   MRN: [id number] ? ?HPI ?Here for preop evaluation before left TKR ? ?Had right TKR in January ?[facility] well[facility] ?Took ASA 81 once or twice a day---walking right away, etc ?Pain was not that bad---got block and didn't need the narcotic pain relievers ?Used tylenol and muscle relaxer ? ?Now ready to have left TKR ?Walking much better with straight right leg ? ?No fever, cough or SOB ?No chest pain ?No dizziness or syncope ?No edema ? ?Current Outpatient Medications on File Prior to Visit  ?Medication Sig Dispense Refill  ? atorvastatin (LIPITOR) 20 MG tablet TAKE 1 TABLET BY MOUTH EVERY DAY 90 tablet 3  ? fluticasone (FLONASE) 50 MCG/ACT nasal spray Place 2 sprays into both nostrils daily. 16 g 6  ? hydrochlorothiazide (MICROZIDE) 12.5 MG capsule TAKE 1 CAPSULE BY MOUTH EVERY DAY IN THE MORNING 90 capsule 3  ? Multiple Vitamin (MULTIVITAMIN) capsule Take 1 capsule by mouth daily.    ? triamcinolone cream (KENALOG) 0.1 % Apply 1 application topically 2 (two) times daily as needed. 45 g 1  ? ?No current facility-administered medications on file prior to visit.  ? ? ?No Known Allergies ? ?Past Medical History:  ?Diagnosis Date  ? History of shingles   ? Hx of colonic polyps   ? adenomatous  ? Hyperlipidemia   ? Hypertension   ? Osteoarthritis   ? in both knees  ? Osteopenia 01/2018  ? T score -1.3 FRAX 7.5% / 0.6%  ? ? ?Past Surgical History:  ?Procedure Laterality Date  ? COLONOSCOPY    ? PATELLAR TENDON REPAIR  06/13/1997  ? left, spurs removed  ? TOTAL KNEE ARTHROPLASTY Right 06/2021  ? TUBAL LIGATION    ? VAGINAL DELIVERY    ? x2  ? ? ?Family History  ?Problem Relation Age of Onset  ? Diabetes Mother   ? Stroke Mother   ? Peripheral vascular disease Mother   ? Coronary artery disease Father   ? Diabetes Father   ? Heart disease Father   ? Brain cancer Maternal Grandmother   ? Cancer Maternal Grandmother   ?      brain  ? Hypertension Neg Hx   ? Hearing loss Neg Hx   ? Colon cancer Neg Hx   ? Esophageal cancer Neg Hx   ? Stomach cancer Neg Hx   ? Rectal cancer Neg Hx   ? ? ?Social History  ? ?Socioeconomic History  ? Marital status: Divorced  ?  Spouse name: Not on file  ? Number of children: 2  ? Years of education: Not on file  ? Highest education level: Not on file  ?Occupational History  ? 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 Smoking status: Never  ?  Passive exposure: Past (Parents were smokers)  ? Smokeless tobacco: Never  ?Vaping Use  ? Vaping Use: Never used  ?Substance and Sexual Activity  ? Alcohol use: Yes  ?  Alcohol/week: 0.0 standard drinks  ?  Comment: occasional  ? Drug use: No  ? Sexual activity: Not Currently  ?  Partners: Male  ?  Birth control/protection: Post-menopausal  ?  Comment: 1st intercourse- 31, partners- 2, divorced  ?Other Topics Concern  ? Not on file  ?Social History Narrative  ? Not on file  ? ?Social Determinants of Health  ? ?Financial Resource Strain: Not on file  ?  Food Insecurity: Not on file  ?Transportation Needs: Not on file  ?Physical Activity: Not on file  ?Stress: Not on file  ?Social Connections: Not on file  ?Intimate Partner Violence: Not on file  ? ?Review of Systems ?Sleeps flat---no PND ?Appetite is good ?   ?Objective:  ? Physical Exam ?Constitutional:   ?   Appearance: Normal appearance.  ?Cardiovascular:  ?   Rate and Rhythm: Normal rate and regular rhythm.  ?   Pulses: Normal pulses.  ?   Heart sounds: No murmur heard. ?  No gallop.  ?Pulmonary:  ?   Effort: Pulmonary effort is normal.  ?   Breath sounds: Normal breath sounds. No wheezing or rales.  ?Abdominal:  ?   Palpations: Abdomen is soft.  ?   Tenderness: There is no abdominal tenderness.  ?Musculoskeletal:  ?   Cervical back: Neck supple.  ?   Right lower leg: No edema.  ?   Left lower leg: No edema.  ?Lymphadenopathy:  ?   Cervical: No cervical adenopathy.  ?Neurological:  ?   Mental  Status: She is alert.  ?Psychiatric:     ?   Mood and Affect: Mood normal.     ?   Behavior: Behavior normal.  ?  ? ? ? ? ?   ?Assessment & Plan:  ? ?

## 2021-10-16 LAB — CBC
HCT: 43.8 % (ref 35.0–45.0)
Hemoglobin: 14.8 g/dL (ref 11.7–15.5)
MCH: 30.3 pg (ref 27.0–33.0)
MCHC: 33.8 g/dL (ref 32.0–36.0)
MCV: 89.6 fL (ref 80.0–100.0)
MPV: 10.7 fL (ref 7.5–12.5)
Platelets: 273 10*3/uL (ref 140–400)
RBC: 4.89 10*6/uL (ref 3.80–5.10)
RDW: 12.3 % (ref 11.0–15.0)
WBC: 7 10*3/uL (ref 3.8–10.8)

## 2021-10-16 LAB — RENAL FUNCTION PANEL
Albumin: 4.4 g/dL (ref 3.6–5.1)
BUN: 13 mg/dL (ref 7–25)
CO2: 18 mmol/L — ABNORMAL LOW (ref 20–32)
Calcium: 9.9 mg/dL (ref 8.6–10.4)
Chloride: 109 mmol/L (ref 98–110)
Creat: 0.77 mg/dL (ref 0.50–1.05)
Glucose, Bld: 106 mg/dL — ABNORMAL HIGH (ref 65–99)
Phosphorus: 3.6 mg/dL (ref 2.1–4.3)
Potassium: 4.2 mmol/L (ref 3.5–5.3)
Sodium: 143 mmol/L (ref 135–146)

## 2021-10-20 ENCOUNTER — Telehealth: Payer: Self-pay | Admitting: Internal Medicine

## 2021-10-20 NOTE — Telephone Encounter (Signed)
OV Note routed to both numbers. ?

## 2021-10-20 NOTE — Telephone Encounter (Signed)
Wells Guiles calling from [facility] stated they received the clearance letter and labs for this pt but in order to clear them for surgery they also need the medical notes for their last visit.  ? ?[facility] Fax Numbers: [phone] ?[phone] ?Callback Number: [phone] ?

## 2022-01-11 ENCOUNTER — Encounter: Payer: Self-pay | Admitting: Family

## 2022-01-11 ENCOUNTER — Ambulatory Visit (INDEPENDENT_AMBULATORY_CARE_PROVIDER_SITE_OTHER): Payer: Managed Care, Other (non HMO) | Admitting: Family

## 2022-01-11 VITALS — BP 130/84 | HR 75 | Temp 97.8°F | Resp 16 | Ht 66.0 in | Wt 216.2 lb

## 2022-01-11 DIAGNOSIS — J0110 Acute frontal sinusitis, unspecified: Secondary | ICD-10-CM | POA: Insufficient documentation

## 2022-01-11 DIAGNOSIS — J029 Acute pharyngitis, unspecified: Secondary | ICD-10-CM | POA: Diagnosis not present

## 2022-01-11 LAB — POCT RAPID STREP A (OFFICE): Rapid Strep A Screen: NEGATIVE

## 2022-01-11 MED ORDER — AMOXICILLIN-POT CLAVULANATE 875-125 MG PO TABS: 1.0000 | ORAL_TABLET | Freq: Two times a day (BID) | ORAL | 0 refills | Status: DC

## 2022-01-11 NOTE — Assessment & Plan Note (Signed)
Strep tested in office, negative Warm salt water gargles   

## 2022-01-11 NOTE — Assessment & Plan Note (Signed)
Prescription given for augmentin 875/125 mg po bid for ten days. Pt to continue tylenol/ibuprofen prn sinus pain. Continue with humidifier prn and steam showers recommended as well. instructed If no symptom improvement in 48 hours please f/u ? ?

## 2022-01-11 NOTE — Progress Notes (Signed)
Established Patient Office Visit  Subjective:  Patient ID: Tanya Gomez, female    DOB: Mar 09, 1956  Age: 66 y.o. MRN: [id number]  CC:  Chief Complaint  Patient presents with   Sore Throat    X 3 days   Ear Pain    HPI Tanya Gomez is here today with concerns.   For the last five days with sore throat, left ear pain.  Some mild nasal congestion.  No coughing.  No fever or chills.  Sinus pressure left frontal and maxillary.   Took advil with mild relief.    Past Medical History:  Diagnosis Date   History of shingles    Hx of colonic polyps    adenomatous   Hyperlipidemia    Hypertension    Osteoarthritis    in both knees   Osteopenia 01/2018   T score -1.3 FRAX 7.5% / 0.6%    Past Surgical History:  Procedure Laterality Date   COLONOSCOPY     PATELLAR TENDON REPAIR  06/13/1997   left, spurs removed   TOTAL KNEE ARTHROPLASTY Right 06/2021   TUBAL LIGATION     VAGINAL DELIVERY     x2    Family History  Problem Relation Age of Onset   Diabetes Mother    Stroke Mother    Peripheral vascular disease Mother    Coronary artery disease Father    Diabetes Father    Heart disease Father    Brain cancer Maternal Grandmother    Cancer Maternal Grandmother        brain   Hypertension Neg Hx    Hearing loss Neg Hx    Colon cancer Neg Hx    Esophageal cancer Neg Hx    Stomach cancer Neg Hx    Rectal cancer Neg Hx     Social History   Socioeconomic History   Marital status: Divorced    Spouse name: Not on file   Number of children: 2   Years of education: Not on file   Highest education level: Not on file  Occupational History   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[employer]  Tobacco Use   Smoking status: Never    Passive exposure: Past (Parents were smokers)   Smokeless tobacco: Never  Vaping Use   Vaping Use: Never used  Substance and Sexual Activity   Alcohol use: Yes    Alcohol/week: 0.0 standard drinks of alcohol    Comment:  occasional   Drug use: No   Sexual activity: Not Currently    Partners: Male    Birth control/protection: Post-menopausal    Comment: 1st intercourse- 36, partners- 2, divorced  Other Topics Concern   Not on file  Social History Narrative   Not on file   Social Determinants of Health   Financial Resource Strain: Not on file  Food Insecurity: Not on file  Transportation Needs: Not on file  Physical Activity: Not on file  Stress: Not on file  Social Connections: Not on file  Intimate Partner Violence: Not on file    Outpatient Medications Prior to Visit  Medication Sig Dispense Refill   atorvastatin (LIPITOR) 20 MG tablet TAKE 1 TABLET BY MOUTH EVERY DAY 90 tablet 3   fluticasone (FLONASE) 50 MCG/ACT nasal spray Place 2 sprays into both nostrils daily. 16 g 6   hydrochlorothiazide (MICROZIDE) 12.5 MG capsule TAKE 1 CAPSULE BY MOUTH EVERY DAY IN THE MORNING 90 capsule 3   Multiple Vitamin (MULTIVITAMIN) capsule Take 1 capsule by mouth  daily.     triamcinolone cream (KENALOG) 0.1 % Apply 1 application topically 2 (two) times daily as needed. 45 g 1   No facility-administered medications prior to visit.    No Known Allergies      Objective:    Physical Exam Constitutional:      General: She is not in acute distress.    Appearance: Normal appearance. She is not ill-appearing.  HENT:     Right Ear: Tympanic membrane normal. There is no impacted cerumen. Tympanic membrane is not erythematous.     Left Ear: Tympanic membrane normal. There is no impacted cerumen. Tympanic membrane is not erythematous.     Ears:     Comments: Bil ear canal with mild red irritation    Nose: Rhinorrhea present. No congestion.     Right Turbinates: Not enlarged or swollen.     Left Turbinates: Not enlarged or swollen.     Right Sinus: No maxillary sinus tenderness or frontal sinus tenderness.     Left Sinus: Maxillary sinus tenderness and frontal sinus tenderness present.     Mouth/Throat:      Mouth: Mucous membranes are moist.     Tongue: No lesions.     Pharynx: Posterior oropharyngeal erythema (cobblestoning) present. No pharyngeal swelling or oropharyngeal exudate.     Tonsils: No tonsillar exudate or tonsillar abscesses. 0 on the right. 0 on the left.  Eyes:     Extraocular Movements: Extraocular movements intact.     Conjunctiva/sclera: Conjunctivae normal.     Pupils: Pupils are equal, round, and reactive to light.  Neck:     Thyroid: No thyroid mass.  Cardiovascular:     Rate and Rhythm: Normal rate and regular rhythm.  Pulmonary:     Effort: Pulmonary effort is normal.     Breath sounds: Normal breath sounds.  Lymphadenopathy:     Cervical:     Right cervical: No superficial cervical adenopathy.    Left cervical: No superficial cervical adenopathy.  Neurological:     Mental Status: She is alert.     BP 130/84   Pulse 75   Temp 97.8 F (36.6 C)   Resp 16   Ht '5\' 6"'$  (1.676 m)   Wt 216 lb 4 oz (98.1 kg)   SpO2 97%   BMI 34.90 kg/m  Wt Readings from Last 3 Encounters:  01/11/22 216 lb 4 oz (98.1 kg)  10/15/21 215 lb (97.5 kg)  07/28/21 213 lb (96.6 kg)     Health Maintenance Due  Topic Date Due   Hepatitis C Screening  Never done   COLONOSCOPY (Pts 45-58yr Insurance coverage will need to be confirmed)  06/25/2019   COVID-19 Vaccine (3 - Pfizer series) 04/28/2020   INFLUENZA VACCINE  01/11/2022    There are no preventive care reminders to display for this patient.  Lab Results  Component Value Date   TSH 0.82 04/12/2013   Lab Results  Component Value Date   WBC 7.0 10/15/2021   HGB 14.8 10/15/2021   HCT 43.8 10/15/2021   MCV 89.6 10/15/2021   PLT 273 10/15/2021   Lab Results  Component Value Date   NA 143 10/15/2021   K 4.2 10/15/2021   CO2 18 (L) 10/15/2021   GLUCOSE 106 (H) 10/15/2021   BUN 13 10/15/2021   CREATININE 0.77 10/15/2021   BILITOT 0.6 07/17/2020   ALKPHOS 70 07/17/2020   AST 16 07/17/2020   ALT 17 07/17/2020    PROT 7.7 07/17/2020  ALBUMIN 4.1 06/16/2021   CALCIUM 9.9 10/15/2021   GFR 91.88 06/16/2021   No results found for: "HGBA1C"    Assessment & Plan:   Problem List Items Addressed This Visit       Respiratory   Acute non-recurrent frontal sinusitis    Prescription given for augmentin 875/125 mg po bid for ten days. Pt to continue tylenol/ibuprofen prn sinus pain. Continue with humidifier prn and steam showers recommended as well. instructed If no symptom improvement in 48 hours please f/u       Relevant Medications   amoxicillin-clavulanate (AUGMENTIN) 875-125 MG tablet     Other   Sore throat - Primary    Strep tested in office, negative  Warm salt water gargles        Relevant Medications   amoxicillin-clavulanate (AUGMENTIN) 875-125 MG tablet   Other Relevant Orders   POCT rapid strep A (Completed)    Meds ordered this encounter  Medications   amoxicillin-clavulanate (AUGMENTIN) 875-125 MG tablet    Sig: Take 1 tablet by mouth 2 (two) times daily.    Dispense:  20 tablet    Refill:  0    Order Specific Question:   Supervising Provider    Answer:   BEDSOLE, AMY E [[id number]]    Follow-up: Return if symptoms worsen or fail to improve with pcp.    Tanya Pancoast, FNP

## 2022-01-11 NOTE — Patient Instructions (Signed)
Start prescription of augmentin 875/125 mg and take as prescribed.  Tylenol/ibuprofen ok for sinus pain as needed Increase oral fluids. Ok to continue with humidifers and hot steamy showers as discussed during visit.  It was a pleasure speaking with you today, I hope you start feeling better soon.  Regards,   Eugenia Pancoast

## 2022-01-20 ENCOUNTER — Encounter: Payer: Self-pay | Admitting: Obstetrics & Gynecology

## 2022-01-20 ENCOUNTER — Other Ambulatory Visit (HOSPITAL_COMMUNITY)
Admission: RE | Admit: 2022-01-20 | Discharge: 2022-01-20 | Disposition: A | Discharge status: Home / Self Care | Payer: Managed Care, Other (non HMO) | Source: Ambulatory Visit | Attending: Obstetrics & Gynecology | Admitting: Obstetrics & Gynecology

## 2022-01-20 ENCOUNTER — Ambulatory Visit (INDEPENDENT_AMBULATORY_CARE_PROVIDER_SITE_OTHER): Payer: Managed Care, Other (non HMO) | Admitting: Obstetrics & Gynecology

## 2022-01-20 VITALS — BP 120/82 | HR 92 | Ht 66.25 in | Wt 216.0 lb

## 2022-01-20 DIAGNOSIS — Z780 Asymptomatic menopausal state: Secondary | ICD-10-CM

## 2022-01-20 DIAGNOSIS — Z01419 Encounter for gynecological examination (general) (routine) without abnormal findings: Secondary | ICD-10-CM | POA: Diagnosis not present

## 2022-01-20 DIAGNOSIS — M85851 Other specified disorders of bone density and structure, right thigh: Secondary | ICD-10-CM

## 2022-01-20 NOTE — Progress Notes (Signed)
Tanya Gomez June 30, 1955 [id number]   History:    66 y.o. G2P2L2 Divorced.  2 children with 4 grand-children.   RP:  Established patient presenting for annual gyn exam   HPI: Postmenopause, well on no hormone replacement therapy.  No postmenopausal bleeding.  No pelvic pain.  Abstinent.  Pap 01/2020 Neg.  No h/o abnormal Pap. Pap reflex today.  No urgency or stress urinary incontinence.  Bowel movements normal. Breasts normal.  Mammo 04/2021 Neg.  Body mass index 34.6.  Will decrease calories.  Walking the dog.  Health labs with family physician.  [place]ono 06/2014, will repeat this year.  BD 01/2021 Osteopenia Rt Fem Neck T-Score -1.1.   Past medical history,surgical history, family history and social history were all reviewed and documented in the EPIC chart.  Gynecologic History No LMP recorded. Patient is postmenopausal.  Obstetric History OB History  Gravida Para Term Preterm AB Living  '4 4 2     2  '$ SAB IAB Ectopic Multiple Live Births               # Outcome Date GA Lbr Len/2nd Weight Sex Delivery Anes PTL Lv  4 Term           3 Term           2 Para      Vag-Spont     1 Para      Vag-Spont        ROS: A ROS was performed and pertinent positives and negatives are included in the history. GENERAL: No fevers or chills. HEENT: No change in vision, no earache, sore throat or sinus congestion. NECK: No pain or stiffness. CARDIOVASCULAR: No chest pain or pressure. No palpitations. PULMONARY: No shortness of breath, cough or wheeze. GASTROINTESTINAL: No abdominal pain, nausea, vomiting or diarrhea, melena or bright red blood per rectum. GENITOURINARY: No urinary frequency, urgency, hesitancy or dysuria. MUSCULOSKELETAL: No joint or muscle pain, no back pain, no recent trauma. DERMATOLOGIC: No rash, no itching, no lesions. ENDOCRINE: No polyuria, polydipsia, no heat or cold intolerance. No recent change in weight. HEMATOLOGICAL: No anemia or easy bruising or bleeding. NEUROLOGIC: No  headache, seizures, numbness, tingling or weakness. PSYCHIATRIC: No depression, no loss of interest in normal activity or change in sleep pattern.     Exam:   BP 120/82   Pulse 92   Ht 5' 6.25" (1.683 m)   Wt 216 lb (98 kg)   SpO2 99%   BMI 34.60 kg/m   Body mass index is 34.6 kg/m.  General appearance : Well developed well nourished female. No acute distress HEENT: Eyes: no retinal hemorrhage or exudates,  Neck supple, trachea midline, no carotid bruits, no thyroidmegaly Lungs: Clear to auscultation, no rhonchi or wheezes, or rib retractions  Heart: Regular rate and rhythm, no murmurs or gallops Breast:Examined in sitting and supine position were symmetrical in appearance, no palpable masses or tenderness,  no skin retraction, no nipple inversion, no nipple discharge, no skin discoloration, no axillary or supraclavicular lymphadenopathy Abdomen: no palpable masses or tenderness, no rebound or guarding Extremities: no edema or skin discoloration or tenderness  Pelvic: Vulva: Normal             Vagina: No gross lesions or discharge  Cervix: No gross lesions or discharge.  Pap reflex done.  Uterus  AV, normal size, shape and consistency, non-tender and mobile  Adnexa  Without masses or tenderness  Anus: Normal   Assessment/Plan:  66 y.o.  female for annual exam   1. Encounter for routine gynecological examination with Papanicolaou smear of cervix Postmenopause, well on no hormone replacement therapy.  No postmenopausal bleeding.  No pelvic pain.  Abstinent.  Pap 01/2020 Neg.  No h/o abnormal Pap. Pap reflex today.  No urgency or stress urinary incontinence.  Bowel movements normal. Breasts normal.  Mammo 04/2021 Neg.  Body mass index 34.6.  Will decrease calories.  Walking the dog.  Health labs with family physician.  Colono 06/2014, will repeat this year.  BD 01/2021 Osteopenia Rt Fem Neck T-Score -1.1. - Cytology - PAP( [place])  2. Postmenopause Postmenopause, well on no  hormone replacement therapy.  No postmenopausal bleeding.  No pelvic pain.  Abstinent.   3. Osteopenia of neck of right femur  BD 01/2021 Osteopenia Rt Fem Neck T-Score -1.1.  Vit D, Ca++ 1.5 g/d, regular weight bearing activities.  Princess Bruins MD, 8:46 AM 01/20/2022

## 2022-01-21 LAB — CYTOLOGY - PAP: Diagnosis: NEGATIVE

## 2022-05-11 ENCOUNTER — Encounter: Payer: Self-pay | Admitting: Obstetrics & Gynecology

## 2022-05-17 LAB — HM MAMMOGRAPHY

## 2022-05-30 ENCOUNTER — Encounter: Payer: Self-pay | Admitting: Internal Medicine

## 2022-06-02 ENCOUNTER — Ambulatory Visit (INDEPENDENT_AMBULATORY_CARE_PROVIDER_SITE_OTHER): Payer: Managed Care, Other (non HMO) | Admitting: *Deleted

## 2022-06-02 DIAGNOSIS — Z23 Encounter for immunization: Secondary | ICD-10-CM

## 2022-07-29 ENCOUNTER — Encounter: Payer: Self-pay | Admitting: Internal Medicine

## 2022-07-29 ENCOUNTER — Ambulatory Visit (INDEPENDENT_AMBULATORY_CARE_PROVIDER_SITE_OTHER): Payer: Managed Care, Other (non HMO) | Admitting: Internal Medicine

## 2022-07-29 VITALS — BP 138/88 | HR 82 | Temp 97.2°F | Ht 66.5 in | Wt 222.0 lb

## 2022-07-29 DIAGNOSIS — I10 Essential (primary) hypertension: Secondary | ICD-10-CM | POA: Diagnosis not present

## 2022-07-29 DIAGNOSIS — E785 Hyperlipidemia, unspecified: Secondary | ICD-10-CM

## 2022-07-29 DIAGNOSIS — Z0000 Encounter for general adult medical examination without abnormal findings: Secondary | ICD-10-CM | POA: Diagnosis not present

## 2022-07-29 LAB — LIPID PANEL
Cholesterol: 175 mg/dL (ref 0–200)
HDL: 57.4 mg/dL (ref >39.00)
LDL Cholesterol: 94 mg/dL (ref 0–99)
NonHDL: 117.31
Total CHOL/HDL Ratio: 3
Triglycerides: 116 mg/dL (ref 0.0–149.0)
VLDL: 23.2 mg/dL (ref 0.0–40.0)

## 2022-07-29 LAB — CBC
HCT: 44.4 % (ref 36.0–46.0)
Hemoglobin: 15 g/dL (ref 12.0–15.0)
MCHC: 33.7 g/dL (ref 30.0–36.0)
MCV: 93.3 fl (ref 78.0–100.0)
Platelets: 264 10*3/uL (ref 150.0–400.0)
RBC: 4.76 Mil/uL (ref 3.87–5.11)
RDW: 12.5 % (ref 11.5–15.5)
WBC: 7 10*3/uL (ref 4.0–10.5)

## 2022-07-29 LAB — COMPREHENSIVE METABOLIC PANEL
ALT: 19 U/L (ref 0–35)
AST: 17 U/L (ref 0–37)
Albumin: 4.5 g/dL (ref 3.5–5.2)
Alkaline Phosphatase: 71 U/L (ref 39–117)
BUN: 15 mg/dL (ref 6–23)
CO2: 30 mEq/L (ref 19–32)
Calcium: 10.4 mg/dL (ref 8.4–10.5)
Chloride: 100 mEq/L (ref 96–112)
Creatinine, Ser: 0.78 mg/dL (ref 0.40–1.20)
GFR: 78.93 mL/min (ref >60.00)
Glucose, Bld: 98 mg/dL (ref 70–99)
Potassium: 3.9 mEq/L (ref 3.5–5.1)
Sodium: 137 mEq/L (ref 135–145)
Total Bilirubin: 0.6 mg/dL (ref 0.2–1.2)
Total Protein: 7.7 g/dL (ref 6.0–8.3)

## 2022-07-29 MED ORDER — TRIAMCINOLONE ACETONIDE 0.1 % EX CREA: 1.0000 | TOPICAL_CREAM | Freq: Two times a day (BID) | CUTANEOUS | 1 refills | Status: DC | PRN

## 2022-07-29 NOTE — Progress Notes (Signed)
Subjective:    Patient ID: Tanya Gomez, female    DOB: 1955-12-23, 67 y.o.   MRN: [id number]  HPI Here for physical  Doing okay Did well with the knee replacements  Got indigestion last night Better now--thinks it was the food  Did join gym---trying to get there Weight is up some  Current Outpatient Medications on File Prior to Visit  Medication Sig Dispense Refill   atorvastatin (LIPITOR) 20 MG tablet TAKE 1 TABLET BY MOUTH EVERY DAY 90 tablet 3   fluticasone (FLONASE) 50 MCG/ACT nasal spray Place 2 sprays into both nostrils daily. (Patient taking differently: Place 2 sprays into both nostrils as needed.) 16 g 6   hydrochlorothiazide (MICROZIDE) 12.5 MG capsule TAKE 1 CAPSULE BY MOUTH EVERY DAY IN THE MORNING 90 capsule 3   Multiple Vitamin (MULTIVITAMIN) capsule Take 1 capsule by mouth daily.     triamcinolone cream (KENALOG) 0.1 % Apply 1 application topically 2 (two) times daily as needed. 45 g 1   No current facility-administered medications on file prior to visit.    No Known Allergies  Past Medical History:  Diagnosis Date   History of shingles    Hx of colonic polyps    adenomatous   Hyperlipidemia    Hypertension    Osteoarthritis    in both knees   Osteopenia 01/2018   T score -1.3 FRAX 7.5% / 0.6%    Past Surgical History:  Procedure Laterality Date   COLONOSCOPY     PATELLAR TENDON REPAIR  06/13/1997   left, spurs removed   REPLACEMENT TOTAL KNEE BILATERAL  2023   TOTAL KNEE ARTHROPLASTY     right 1/23, left 5/23   TUBAL LIGATION     VAGINAL DELIVERY     x2    Family History  Problem Relation Age of Onset   Diabetes Mother    Stroke Mother    Peripheral vascular disease Mother    Coronary artery disease Father    Diabetes Father    Heart disease Father    Brain cancer Maternal Grandmother    Cancer Maternal Grandmother        brain    Social History   Socioeconomic History   Marital status: Divorced    Spouse name: Not on file    Number of children: 2   Years of education: Not on file   Highest education level: Not on file  Occupational History   Occupation: [email or web address]  Tobacco Use   Smoking status: Never    Passive exposure: Past (Parents were smokers)   Smokeless tobacco: Never  Vaping Use   Vaping Use: Never used  Substance and Sexual Activity   Alcohol use: Yes    Alcohol/week: 0.0 standard drinks of alcohol    Comment: occasional   Drug use: No   Sexual activity: Not Currently    Partners: Male    Birth control/protection: Post-menopausal, Surgical    Comment: 1st intercourse- 19, partners- 2, btl  Other Topics Concern   Not on file  Social History Narrative   Not on file   Social Determinants of Health   Financial Resource Strain: Not on file  Food Insecurity: Not on file  Transportation Needs: Not on file  Physical Activity: Not on file  Stress: Not on file  Social Connections: Not on file  Intimate Partner Violence: Not on file   Review of Systems  Constitutional:  Negative for fatigue.       Wears  seat belt  HENT:  Positive for tinnitus. Negative for dental problem.        Decreased hearing on left--discussed audiology  Eyes:  Negative for visual disturbance.       No diplopia or unilateral vision loss  Respiratory:  Negative for cough, chest tightness and shortness of breath.   Cardiovascular:  Negative for chest pain, palpitations and leg swelling.  Gastrointestinal:  Negative for blood in stool and constipation.       Rare heartburn--tums  Endocrine: Negative for polydipsia and polyuria.  Genitourinary:  Negative for dyspareunia, dysuria and hematuria.  Musculoskeletal:  Negative for arthralgias and joint swelling.       Some back stiffness at times  Skin:  Negative for rash.  Allergic/Immunologic: Negative for immunocompromised state.       Mild sinus issues---uses advil allergy and flonase  Neurological:  Negative for dizziness, syncope and  light-headedness.       Occ sinus headaches  Hematological:  Negative for adenopathy. Does not bruise/bleed easily.  Psychiatric/Behavioral:  Negative for dysphoric mood and sleep disturbance. The patient is not nervous/anxious.        Objective:   Physical Exam Constitutional:      Appearance: Normal appearance.  HENT:     Mouth/Throat:     Pharynx: No oropharyngeal exudate or posterior oropharyngeal erythema.  Eyes:     Conjunctiva/sclera: Conjunctivae normal.     Pupils: Pupils are equal, round, and reactive to light.  Cardiovascular:     Rate and Rhythm: Normal rate and regular rhythm.     Pulses: Normal pulses.     Heart sounds: No murmur heard.    No gallop.  Pulmonary:     Effort: Pulmonary effort is normal.     Breath sounds: Normal breath sounds. No wheezing or rales.  Abdominal:     Palpations: Abdomen is soft.     Tenderness: There is no abdominal tenderness.  Musculoskeletal:     Cervical back: Neck supple.     Right lower leg: No edema.     Left lower leg: No edema.  Lymphadenopathy:     Cervical: No cervical adenopathy.  Skin:    Findings: No rash.  Neurological:     General: No focal deficit present.     Mental Status: She is alert and oriented to person, place, and time.  Psychiatric:        Mood and Affect: Mood normal.        Behavior: Behavior normal.            Assessment & Plan:

## 2022-07-29 NOTE — Assessment & Plan Note (Signed)
Primary prevention with atorvastatin 

## 2022-07-29 NOTE — Assessment & Plan Note (Signed)
Healthy but really needs to work on fitness and healthier eating Colon overdue--will contact Dr Fuller Plan about setting this up Recent mammogram---every 1-2 years till at least 65 Recent pap--should be done with these Not excited about COVID vaccine--but flu vaccine in the fall Consider RSV

## 2022-07-29 NOTE — Assessment & Plan Note (Signed)
BP Readings from Last 3 Encounters:  07/29/22 138/88  01/20/22 120/82  01/11/22 130/84   Controlled with HCTZ 12.71m Will check labs

## 2022-08-01 ENCOUNTER — Telehealth: Payer: Self-pay

## 2022-08-01 NOTE — Telephone Encounter (Signed)
-----   Message from Ladene Artist, MD sent at 08/01/2022  7:15 AM EST ----- Denice Paradise, we will contact her. Thanks.  Estill Bamberg, based on the updated guidelines, with no polyps on her last colonoscopy, she can move to a 10 year interval for her colonoscopy in Jan 2026. Thanks.   ----- Message ----- From: Venia Carbon, MD Sent: 07/29/2022   9:28 AM EST To: Ladene Artist, MD  Norberto Sorenson, She didn't answer her colon recall last year due to other issues (bilateral TKR). She is ready now. Thanks, Denice Paradise

## 2022-08-01 NOTE — Telephone Encounter (Signed)
Informed patient of Dr. Lynne Leader recommendation with the new guidelines. Patient verbalized understanding and recall placed for 06/2024.

## 2022-08-18 ENCOUNTER — Other Ambulatory Visit: Payer: Self-pay | Admitting: Internal Medicine

## 2022-08-23 ENCOUNTER — Ambulatory Visit (INDEPENDENT_AMBULATORY_CARE_PROVIDER_SITE_OTHER): Payer: Managed Care, Other (non HMO) | Admitting: Internal Medicine

## 2022-08-23 ENCOUNTER — Encounter: Payer: Self-pay | Admitting: Internal Medicine

## 2022-08-23 VITALS — BP 126/84 | HR 80 | Temp 97.4°F | Ht 66.25 in | Wt 221.0 lb

## 2022-08-23 DIAGNOSIS — L723 Sebaceous cyst: Secondary | ICD-10-CM | POA: Diagnosis not present

## 2022-08-23 DIAGNOSIS — R222 Localized swelling, mass and lump, trunk: Secondary | ICD-10-CM | POA: Insufficient documentation

## 2022-08-23 NOTE — Progress Notes (Signed)
Subjective:    Patient ID: Tanya Gomez, female    DOB: August 01, 1955, 67 y.o.   MRN: [id number]  HPI Here due to a knot on her back  Son noticed it a couple of months ago---no pain though She had slight pain when sitting at her desk--more raised and noticeable No drainage No redness  Current Outpatient Medications on File Prior to Visit  Medication Sig Dispense Refill   atorvastatin (LIPITOR) 20 MG tablet TAKE 1 TABLET BY MOUTH EVERY DAY 90 tablet 3   fluticasone (FLONASE) 50 MCG/ACT nasal spray Place 2 sprays into both nostrils daily. (Patient taking differently: Place 2 sprays into both nostrils as needed.) 16 g 6   hydrochlorothiazide (MICROZIDE) 12.5 MG capsule TAKE 1 CAPSULE BY MOUTH EVERY DAY IN THE MORNING 90 capsule 3   Multiple Vitamin (MULTIVITAMIN) capsule Take 1 capsule by mouth daily.     triamcinolone cream (KENALOG) 0.1 % Apply 1 Application topically 2 (two) times daily as needed. 45 g 1   No current facility-administered medications on file prior to visit.    No Known Allergies  Past Medical History:  Diagnosis Date   History of shingles    Hx of colonic polyps    adenomatous   Hyperlipidemia    Hypertension    Osteoarthritis    in both knees   Osteopenia 01/2018   T score -1.3 FRAX 7.5% / 0.6%    Past Surgical History:  Procedure Laterality Date   COLONOSCOPY     PATELLAR TENDON REPAIR  06/13/1997   left, spurs removed   REPLACEMENT TOTAL KNEE BILATERAL  2023   TOTAL KNEE ARTHROPLASTY     right 1/23, left 5/23   TUBAL LIGATION     VAGINAL DELIVERY     x2    Family History  Problem Relation Age of Onset   Diabetes Mother    Stroke Mother    Peripheral vascular disease Mother    Coronary artery disease Father    Diabetes Father    Heart disease Father    Brain cancer Maternal Grandmother    Cancer Maternal Grandmother        brain    Social History   Socioeconomic History   Marital status: Divorced    Spouse name: Not on file    Number of children: 2   Years of education: Not on file   Highest education level: Not on file  Occupational History   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[email or web address]  [employer]  Tobacco Use   Smoking status: Never    Passive exposure: Past (Parents were smokers)   Smokeless tobacco: Never  Vaping Use   Vaping Use: Never used  Substance and Sexual Activity   Alcohol use: Yes    Alcohol/week: 0.0 standard drinks of alcohol    Comment: occasional   Drug use: No   Sexual activity: Not Currently    Partners: Male    Birth control/protection: Post-menopausal, Surgical    Comment: 1st intercourse- 19, partners- 2, btl  Other Topics Concern   Not on file  Social History Narrative   Not on file   Social Determinants of Health   Financial Resource Strain: Not on file  Food Insecurity: Not on file  Transportation Needs: Not on file  Physical Activity: Not on file  Stress: Not on file  Social Connections: Not on file  Intimate Partner Violence: Not on file   Review of Systems No fever No injury to back     Objective:  Physical Exam Constitutional:      Appearance: Normal appearance.  Skin:    Comments: Soft non inflamed subcutaneous mass just above bra line on left ~4 cm round Fairly circumscribed but not clearly a cyst  Neurological:     Mental Status: She is alert.            Assessment & Plan:

## 2022-08-23 NOTE — Assessment & Plan Note (Signed)
Vs lipoma Fairly deep so infection risk is lower Reassured--no action needed now If grows, or increased pain---will refer to general surgery ([facility])

## 2023-01-26 ENCOUNTER — Ambulatory Visit: Payer: Managed Care, Other (non HMO) | Admitting: Obstetrics & Gynecology

## 2023-02-28 ENCOUNTER — Encounter: Payer: Self-pay | Admitting: Radiology

## 2023-02-28 ENCOUNTER — Ambulatory Visit (INDEPENDENT_AMBULATORY_CARE_PROVIDER_SITE_OTHER): Payer: Managed Care, Other (non HMO) | Admitting: Radiology

## 2023-02-28 VITALS — BP 138/84 | Ht 66.0 in | Wt 223.0 lb

## 2023-02-28 DIAGNOSIS — M8580 Other specified disorders of bone density and structure, unspecified site: Secondary | ICD-10-CM

## 2023-02-28 DIAGNOSIS — Z01419 Encounter for gynecological examination (general) (routine) without abnormal findings: Secondary | ICD-10-CM

## 2023-02-28 DIAGNOSIS — N3943 Post-void dribbling: Secondary | ICD-10-CM | POA: Diagnosis not present

## 2023-02-28 NOTE — Progress Notes (Signed)
Tanya Gomez May 25, 1956 [id number]   History: Postmenopausal 67 y.o. presents for annual exam. Having some post void dribbling she had noticed more frequently. No other gyn concerns. Not on HRT, doing well.    Gynecologic History Postmenopausal Last Pap: 2023. Results were: normal Last mammogram: 2023. Results were: normal Last colonoscopy: 2016 DEXA:2022   Obstetric History OB History  Gravida Para Term Preterm AB Living  4 4 2     2   SAB IAB Ectopic Multiple Live Births               # Outcome Date GA Lbr Len/2nd Weight Sex Type Anes PTL Lv  4 Term           3 Term           2 Para      Vag-Spont     1 Para      Vag-Spont        The following portions of the patient's history were reviewed and updated as appropriate: allergies, current medications, past family history, past medical history, past social history, past surgical history, and problem list.  Review of Systems Pertinent items noted in HPI and remainder of comprehensive ROS otherwise negative.  Past medical history, past surgical history, family history and social history were all reviewed and documented in the EPIC chart.  Exam:  Vitals:   02/28/23 1525  BP: 138/84  Weight: 223 lb (101.2 kg)  Height: 5\' 6"  (1.676 m)   Body mass index is 35.99 kg/m.  General appearance:  Normal Thyroid:  Symmetrical, normal in size, without palpable masses or nodularity. Respiratory  Auscultation:  Clear without wheezing or rhonchi Cardiovascular  Auscultation:  Regular rate, without rubs, murmurs or gallops  Edema/varicosities:  Not grossly evident Abdominal  Soft,nontender, without masses, guarding or rebound.  Liver/spleen:  No organomegaly noted  Hernia:  None appreciated  Skin  Inspection:  Grossly normal Breasts: Examined lying and sitting.   Right: Without masses, retractions, nipple discharge or axillary adenopathy.   Left: Without masses, retractions, nipple discharge or axillary  adenopathy. Genitourinary   Inguinal/mons:  Normal without inguinal adenopathy  External genitalia:  Normal appearing vulva with no masses, tenderness, or lesions  BUS/Urethra/Skene's glands:  Normal  Vagina:  Normal appearing with normal color and discharge, no lesions. Atrophy: mild   Cervix:  Normal appearing without discharge or lesions  Uterus:  Normal in size, shape and contour.  Midline and mobile, nontender  Adnexa/parametria:     Rt: Normal in size, without masses or tenderness.   Lt: Normal in size, without masses or tenderness.  Anus and perineum: Normal    Tanya Gomez, CMA present for exam  Assessment/Plan:   1. Well woman exam with routine gynecological exam Pap up to date Mammo due 12/24  2. Osteopenia, unspecified location - DG Bone Density; Future  3. Post-void dribbling Kegels reviewed, if no improvement will send for PFPT   Discussed SBE, colonoscopy and DEXA screening as directed. Recommend of exercise weekly, including weight bearing exercise. Encouraged the use of seatbelts and sunscreen.  Return in 1 year for annual or sooner prn.  Tanda Rockers WHNP-BC, 3:46 PM 02/28/2023

## 2023-03-03 ENCOUNTER — Telehealth: Payer: Self-pay | Admitting: Radiology

## 2023-03-03 NOTE — Telephone Encounter (Signed)
Tanya Gomez, Tanya Curl, Tanya Gomez  Tanya Gomez Based on her report it should have actually been 2 years, not 3.       Previous Messages    ----- Message ----- From: Leda Min, RN Sent: 03/02/2023   4:15 PM EDT To: Tanda Rockers, Tanya Gomez Subject: FW: ? bone density                             ----- Message ----- From: Tanya Gomez Sent: 03/01/2023   9:57 AM EDT To: [facility] Triage Subject: ? bone density                                Patient had bone density done on 03-05-21 and Seymour Bars stated for patient to return in 3 years. Jami place order for bone density. Please confirm with Jami if bone density to be done 2024 or 2025? Thx

## 2023-05-22 LAB — HM MAMMOGRAPHY

## 2023-05-31 ENCOUNTER — Ambulatory Visit (INDEPENDENT_AMBULATORY_CARE_PROVIDER_SITE_OTHER): Payer: Managed Care, Other (non HMO) | Admitting: Internal Medicine

## 2023-05-31 ENCOUNTER — Encounter: Payer: Self-pay | Admitting: Internal Medicine

## 2023-05-31 VITALS — BP 138/86 | HR 88 | Temp 98.4°F | Ht 66.0 in | Wt 223.0 lb

## 2023-05-31 DIAGNOSIS — J0100 Acute maxillary sinusitis, unspecified: Secondary | ICD-10-CM

## 2023-05-31 MED ORDER — DOXYCYCLINE HYCLATE 100 MG PO TABS: 100.0000 mg | ORAL_TABLET | Freq: Two times a day (BID) | ORAL | 1 refills | Status: DC

## 2023-05-31 NOTE — Progress Notes (Signed)
Subjective:    Patient ID: Tanya Gomez, female    DOB: 07-25-1955, 67 y.o.   MRN: [id number]  HPI Here due to respiratory infection  Left ear pain Sinus stuffiness and pain in maxillary area Lots of post nasal drip--especially at night Started about 2 weeks ago--taking advil cold and sinus--not helping No fever, chills or sweats Not SOB No sore throat  Also taking regular advil  Current Outpatient Medications on File Prior to Visit  Medication Sig Dispense Refill   atorvastatin (LIPITOR) 20 MG tablet TAKE 1 TABLET BY MOUTH EVERY DAY 90 tablet 3   fluticasone (FLONASE) 50 MCG/ACT nasal spray Place 2 sprays into both nostrils daily. (Patient taking differently: Place 2 sprays into both nostrils as needed.) 16 g 6   hydrochlorothiazide (MICROZIDE) 12.5 MG capsule TAKE 1 CAPSULE BY MOUTH EVERY DAY IN THE MORNING 90 capsule 3   Multiple Vitamin (MULTIVITAMIN) capsule Take 1 capsule by mouth daily.     triamcinolone cream (KENALOG) 0.1 % Apply 1 Application topically 2 (two) times daily as needed. 45 g 1   No current facility-administered medications on file prior to visit.    No Known Allergies  Past Medical History:  Diagnosis Date   History of shingles    Hx of colonic polyps    adenomatous   Hyperlipidemia    Hypertension    Osteoarthritis    in both knees   Osteopenia 01/2018   T score -1.3 FRAX 7.5% / 0.6%    Past Surgical History:  Procedure Laterality Date   COLONOSCOPY     PATELLAR TENDON REPAIR  06/13/1997   left, spurs removed   REPLACEMENT TOTAL KNEE BILATERAL  2023   TOTAL KNEE ARTHROPLASTY     right 1/23, left 5/23   TUBAL LIGATION     VAGINAL DELIVERY     x2    Family History  Problem Relation Age of Onset   Diabetes Mother    Stroke Mother    Peripheral vascular disease Mother    Coronary artery disease Father    Diabetes Father    Heart disease Father    Brain cancer Maternal Grandmother    Cancer Maternal Grandmother        brain     Social History   Socioeconomic History   Marital status: Divorced    Spouse name: Not on file   Number of children: 2   Years of education: Not on file   Highest education level: Not on file  Occupational History   Occupation: [email or web address]  Tobacco Use   Smoking status: Never    Passive exposure: Past (Parents were smokers)   Smokeless tobacco: Never  Vaping Use   Vaping status: Never Used  Substance and Sexual Activity   Alcohol use: Yes    Alcohol/week: 0.0 standard drinks of alcohol    Comment: occasional   Drug use: No   Sexual activity: Not Currently    Partners: Male    Birth control/protection: Post-menopausal, Surgical    Comment: 1st intercourse- 19, partners- 2, btl  Other Topics Concern   Not on file  Social History Narrative   Not on file   Social Drivers of Health   Financial Resource Strain: Not on file  Food Insecurity: Not on file  Transportation Needs: Not on file  Physical Activity: Not on file  Stress: Not on file  Social Connections: Not on file  Intimate Partner Violence: Not on file   Review of  Systems No N/V Eating okay Didn't test for COVID     Objective:   Physical Exam Constitutional:      Appearance: Normal appearance.  HENT:     Head:     Comments: Mild left maxillary tenderness    Right Ear: Tympanic membrane and ear canal normal.     Left Ear: Tympanic membrane and ear canal normal.     Mouth/Throat:     Pharynx: No oropharyngeal exudate or posterior oropharyngeal erythema.  Neck:     Comments: ?small non tender anterior cervical nodes Pulmonary:     Effort: Pulmonary effort is normal.     Breath sounds: Normal breath sounds. No wheezing or rales.  Musculoskeletal:     Cervical back: Neck supple.  Neurological:     Mental Status: She is alert.            Assessment & Plan:

## 2023-05-31 NOTE — Assessment & Plan Note (Signed)
Seems just sinus--but Mycoplasma around and had some ear pain. Less likely at her age Discussed supportive care Will give doxy 100 mg bid x 7 days---instead of augmentin (refill just in case)

## 2023-06-02 ENCOUNTER — Encounter: Payer: Self-pay | Admitting: Internal Medicine

## 2023-06-13 ENCOUNTER — Encounter: Payer: Self-pay | Admitting: Internal Medicine

## 2023-06-13 MED ORDER — AMOXICILLIN-POT CLAVULANATE 875-125 MG PO TABS: 1.0000 | ORAL_TABLET | Freq: Two times a day (BID) | ORAL | 0 refills | Status: DC

## 2023-07-31 ENCOUNTER — Ambulatory Visit (INDEPENDENT_AMBULATORY_CARE_PROVIDER_SITE_OTHER): Payer: Managed Care, Other (non HMO) | Admitting: Internal Medicine

## 2023-07-31 ENCOUNTER — Encounter: Payer: Self-pay | Admitting: Internal Medicine

## 2023-07-31 VITALS — BP 148/88 | HR 79 | Temp 98.0°F | Ht 66.25 in | Wt 219.0 lb

## 2023-07-31 DIAGNOSIS — M7502 Adhesive capsulitis of left shoulder: Secondary | ICD-10-CM

## 2023-07-31 DIAGNOSIS — I10 Essential (primary) hypertension: Secondary | ICD-10-CM

## 2023-07-31 DIAGNOSIS — Z23 Encounter for immunization: Secondary | ICD-10-CM

## 2023-07-31 DIAGNOSIS — Z1159 Encounter for screening for other viral diseases: Secondary | ICD-10-CM | POA: Diagnosis not present

## 2023-07-31 DIAGNOSIS — R222 Localized swelling, mass and lump, trunk: Secondary | ICD-10-CM

## 2023-07-31 DIAGNOSIS — Z0000 Encounter for general adult medical examination without abnormal findings: Secondary | ICD-10-CM

## 2023-07-31 DIAGNOSIS — M7500 Adhesive capsulitis of unspecified shoulder: Secondary | ICD-10-CM | POA: Insufficient documentation

## 2023-07-31 LAB — CBC
HCT: 43.9 % (ref 36.0–46.0)
Hemoglobin: 15 g/dL (ref 12.0–15.0)
MCHC: 34.3 g/dL (ref 30.0–36.0)
MCV: 92.5 fL (ref 78.0–100.0)
Platelets: 298 10*3/uL (ref 150.0–400.0)
RBC: 4.74 Mil/uL (ref 3.87–5.11)
RDW: 12.1 % (ref 11.5–15.5)
WBC: 7.7 10*3/uL (ref 4.0–10.5)

## 2023-07-31 LAB — COMPREHENSIVE METABOLIC PANEL
ALT: 19 U/L (ref 0–35)
AST: 18 U/L (ref 0–37)
Albumin: 4.5 g/dL (ref 3.5–5.2)
Alkaline Phosphatase: 76 U/L (ref 39–117)
BUN: 15 mg/dL (ref 6–23)
CO2: 30 meq/L (ref 19–32)
Calcium: 9.6 mg/dL (ref 8.4–10.5)
Chloride: 103 meq/L (ref 96–112)
Creatinine, Ser: 0.72 mg/dL (ref 0.40–1.20)
GFR: 86.28 mL/min (ref >60.00)
Glucose, Bld: 102 mg/dL — ABNORMAL HIGH (ref 70–99)
Potassium: 4 meq/L (ref 3.5–5.1)
Sodium: 140 meq/L (ref 135–145)
Total Bilirubin: 0.7 mg/dL (ref 0.2–1.2)
Total Protein: 7.8 g/dL (ref 6.0–8.3)

## 2023-07-31 LAB — LIPID PANEL
Cholesterol: 162 mg/dL (ref 0–200)
HDL: 53.4 mg/dL (ref >39.00)
LDL Cholesterol: 85 mg/dL (ref 0–99)
NonHDL: 108.9
Total CHOL/HDL Ratio: 3
Triglycerides: 120 mg/dL (ref 0.0–149.0)
VLDL: 24 mg/dL (ref 0.0–40.0)

## 2023-07-31 MED ORDER — TRIAMTERENE-HCTZ 37.5-25 MG PO TABS: 1.0000 | ORAL_TABLET | Freq: Every day | ORAL | 3 refills | Status: AC

## 2023-07-31 NOTE — Assessment & Plan Note (Signed)
Mild Discussed ROM and topical diclofenac Consider cortisone injection if not improving

## 2023-07-31 NOTE — Assessment & Plan Note (Signed)
Lipoma vs cyst Will set up with surgeon

## 2023-07-31 NOTE — Progress Notes (Signed)
Subjective:    Patient ID: Tanya Gomez, female    DOB: 1956-02-26, 68 y.o.   MRN: [id number]  HPI Here for physical  Still has lump on back Will hurt sometimes when she sits back in chair Ready to see surgeon  Also pain along left neck and to shoulder and down arm Daughter felt it was just tension No arm weakness  Current Outpatient Medications on File Prior to Visit  Medication Sig Dispense Refill   atorvastatin (LIPITOR) 20 MG tablet TAKE 1 TABLET BY MOUTH EVERY DAY 90 tablet 3   fluticasone (FLONASE) 50 MCG/ACT nasal spray Place 2 sprays into both nostrils daily. (Patient taking differently: Place 2 sprays into both nostrils as needed.) 16 g 6   hydrochlorothiazide (MICROZIDE) 12.5 MG capsule TAKE 1 CAPSULE BY MOUTH EVERY DAY IN THE MORNING 90 capsule 3   Multiple Vitamin (MULTIVITAMIN) capsule Take 1 capsule by mouth daily.     triamcinolone cream (KENALOG) 0.1 % Apply 1 Application topically 2 (two) times daily as needed. 45 g 1   No current facility-administered medications on file prior to visit.    No Known Allergies  Past Medical History:  Diagnosis Date   History of shingles    Hx of colonic polyps    adenomatous   Hyperlipidemia    Hypertension    Osteoarthritis    in both knees   Osteopenia 01/2018   T score -1.3 FRAX 7.5% / 0.6%    Past Surgical History:  Procedure Laterality Date   COLONOSCOPY     PATELLAR TENDON REPAIR  06/13/1997   left, spurs removed   REPLACEMENT TOTAL KNEE BILATERAL  2023   TOTAL KNEE ARTHROPLASTY     right 1/23, left 5/23   TUBAL LIGATION     VAGINAL DELIVERY     x2    Family History  Problem Relation Age of Onset   Diabetes Mother    Stroke Mother    Peripheral vascular disease Mother    Coronary artery disease Father    Diabetes Father    Heart disease Father    Brain cancer Maternal Grandmother    Cancer Maternal Grandmother        brain    Social History   Socioeconomic History   Marital status:  Divorced    Spouse name: Not on file   Number of children: 2   Years of education: Not on file   Highest education level: Not on file  Occupational History   Occupation: [email or web address]  Tobacco Use   Smoking status: Never    Passive exposure: Past (Parents were smokers)   Smokeless tobacco: Never  Vaping Use   Vaping status: Never Used  Substance and Sexual Activity   Alcohol use: Yes    Alcohol/week: 0.0 standard drinks of alcohol    Comment: occasional   Drug use: No   Sexual activity: Not Currently    Partners: Male    Birth control/protection: Post-menopausal, Surgical    Comment: 1st intercourse- 19, partners- 2, btl  Other Topics Concern   Not on file  Social History Narrative   Not on file   Social Drivers of Health   Financial Resource Strain: Not on file  Food Insecurity: Not on file  Transportation Needs: Not on file  Physical Activity: Not on file  Stress: Not on file  Social Connections: Not on file  Intimate Partner Violence: Not on file   Review of Systems  Constitutional:  Negative  for fatigue.       Inconsistent with exercise Wears seat belt  HENT:  Negative for trouble swallowing.        Hearing loss and "static" sound in right ear Teeth are okay---keeps up with dentist  Eyes:  Negative for visual disturbance.       No diplopia or or unilateral vision loss  Respiratory:  Negative for cough, chest tightness and shortness of breath.   Cardiovascular:  Negative for chest pain, palpitations and leg swelling.  Gastrointestinal:  Negative for blood in stool and constipation.       Occ heartnburn--tums help  Endocrine: Negative for polydipsia and polyuria.  Genitourinary:  Negative for dyspareunia, dysuria and hematuria.  Musculoskeletal:  Positive for neck pain. Negative for arthralgias, back pain and joint swelling.  Skin:  Negative for rash.  Allergic/Immunologic: Positive for environmental allergies. Negative for  immunocompromised state.       Uses advil sinus---with change in weather  Neurological:  Negative for dizziness, syncope, light-headedness and headaches.  Hematological:  Negative for adenopathy. Does not bruise/bleed easily.  Psychiatric/Behavioral:  Negative for dysphoric mood and sleep disturbance. The patient is not nervous/anxious.        Objective:   Physical Exam Constitutional:      Appearance: Normal appearance.  HENT:     Mouth/Throat:     Pharynx: No oropharyngeal exudate or posterior oropharyngeal erythema.  Eyes:     Conjunctiva/sclera: Conjunctivae normal.     Pupils: Pupils are equal, round, and reactive to light.  Cardiovascular:     Rate and Rhythm: Normal rate and regular rhythm.     Pulses: Normal pulses.     Heart sounds: No murmur heard.    No gallop.  Pulmonary:     Effort: Pulmonary effort is normal.     Breath sounds: Normal breath sounds. No wheezing or rales.  Abdominal:     Palpations: Abdomen is soft.     Tenderness: There is no abdominal tenderness.  Musculoskeletal:     Cervical back: Neck supple.     Right lower leg: No edema.     Left lower leg: No edema.     Comments: Decreased abduction and external rotation in left shoulder  Lymphadenopathy:     Cervical: No cervical adenopathy.  Skin:    Findings: No rash.     Comments: Mass on left mid back  Neurological:     General: No focal deficit present.     Mental Status: She is alert and oriented to person, place, and time.  Psychiatric:        Mood and Affect: Mood normal.        Behavior: Behavior normal.            Assessment & Plan:

## 2023-07-31 NOTE — Assessment & Plan Note (Signed)
BP Readings from Last 3 Encounters:  07/31/23 (!) 148/88  05/31/23 138/86  02/28/23 138/84   Will increase the hydrochlorothiazide to 25mg --with triamterene She will monitor at home

## 2023-07-31 NOTE — Assessment & Plan Note (Signed)
Doing some exercise Flu vaccine today Prefers no COVID vaccine Colon due Jan 2026 Yearly mammogram till at least 90 Done with paps

## 2023-08-01 LAB — HEPATITIS C ANTIBODY: Hepatitis C Ab: NONREACTIVE

## 2023-08-09 ENCOUNTER — Other Ambulatory Visit: Payer: Self-pay | Admitting: Internal Medicine

## 2023-08-09 NOTE — Progress Notes (Unsigned)
 Patient ID: Tanya Gomez, female   DOB: 17-Oct-1955, 68 y.o.   MRN: [id number]  Chief Complaint: Back mass for 1 year  History of Present Illness Tanya Gomez is a 68 y.o. female with a soft tissue mass of her left periscapular area, present for a year.  She reports she believes it has grown slightly over the year.  Tender with pressure, bumping against chairback etc.  Denies any history of drainage overlying skin issues.  Reports no other symptoms.  Denies any other similar lesions elsewhere.  Past Medical History Past Medical History:  Diagnosis Date   History of shingles    Hx of colonic polyps    adenomatous   Hyperlipidemia    Hypertension    Osteoarthritis    in both knees   Osteopenia 01/2018   T score -1.3 FRAX 7.5% / 0.6%      Past Surgical History:  Procedure Laterality Date   COLONOSCOPY     PATELLAR TENDON REPAIR  06/13/1997   left, spurs removed   REPLACEMENT TOTAL KNEE BILATERAL  2023   TOTAL KNEE ARTHROPLASTY     right 1/23, left 5/23   TUBAL LIGATION     VAGINAL DELIVERY     x2    No Known Allergies  Current Outpatient Medications  Medication Sig Dispense Refill   fluticasone (FLONASE) 50 MCG/ACT nasal spray Place 2 sprays into both nostrils daily. (Patient taking differently: Place 2 sprays into both nostrils as needed.) 16 g 6   Multiple Vitamin (MULTIVITAMIN) capsule Take 1 capsule by mouth daily.     triamcinolone cream (KENALOG) 0.1 % Apply 1 Application topically 2 (two) times daily as needed. 45 g 1   triamterene-hydrochlorothiazide (MAXZIDE-25) 37.5-25 MG tablet Take 1 tablet by mouth daily. 90 tablet 3   No current facility-administered medications for this visit.    Family History Family History  Problem Relation Age of Onset   Diabetes Mother    Stroke Mother    Peripheral vascular disease Mother    Coronary artery disease Father    Diabetes Father    Heart disease Father    Brain cancer Maternal Grandmother    Cancer Maternal  Grandmother        brain      Social History Social History   Tobacco Use   Smoking status: Never    Passive exposure: Past (Parents were smokers)   Smokeless tobacco: Never  Vaping Use   Vaping status: Never Used  Substance Use Topics   Alcohol use: Yes    Alcohol/week: 0.0 standard drinks of alcohol    Comment: occasional   Drug use: No        Review of Systems  Constitutional: Negative.   HENT:  Positive for hearing loss and tinnitus.   Eyes: Negative.   Respiratory: Negative.    Cardiovascular: Negative.   Gastrointestinal: Negative.   Genitourinary: Negative.   Skin: Negative.   Neurological: Negative.   Psychiatric/Behavioral: Negative.       Physical Exam Blood pressure (!) 181/94, pulse 99, temperature 98 F (36.7 C), temperature source Oral, height 5\' 6"  (1.676 m), weight 217 lb 3.2 oz (98.5 kg), SpO2 96%. Last Weight  Most recent update: 08/10/2023  9:39 AM    Weight  98.5 kg (217 lb 3.2 oz)             CONSTITUTIONAL: Well developed, and nourished, appropriately responsive and aware without distress.   EYES: Sclera non-icteric.   EARS, NOSE, MOUTH  AND THROAT: The oropharynx is clear. Oral mucosa is pink and moist.    Hearing is intact to voice.  NECK: Trachea is midline, and there is no jugular venous distension.  LYMPH NODES:  Lymph nodes in the neck are not appreciated. RESPIRATORY:   Normal respiratory effort without pathologic use of accessory muscles. CARDIOVASCULAR:  Well perfused.  GI: The abdomen is  soft, nontender, and nondistended. MUSCULOSKELETAL:  Symmetrical muscle tone appreciated in all four extremities.    SKIN: Skin turgor is normal. No pathologic skin lesions appreciated.  Soft tissue mass medial to the left scapula inferior to its widest point, approximately 4 to 5 cm in diameter, perhaps 1 to 1-1/2 cm raised, consistent with subcutaneous location, not muscular.  Soft and fleshy consistent with lipoma.  No overlying skin changes  suggestive of dermal pathology. NEUROLOGIC:  Motor and sensation appear grossly normal.  Cranial nerves are grossly without defect. PSYCH:  Alert and oriented to person, place and time. Affect is appropriate for situation.  Data Reviewed I have personally reviewed what is currently available of the patient's imaging, recent labs and medical records.   Labs:     Latest Ref Rng & Units 07/31/2023    9:21 AM 07/29/2022    9:44 AM 10/15/2021    8:04 AM  CBC  WBC 4.0 - 10.5 K/uL 7.7  7.0  7.0   Hemoglobin 12.0 - 15.0 g/dL 45.4  09.8  11.9   Hematocrit 36.0 - 46.0 % 43.9  44.4  43.8   Platelets 150.0 - 400.0 K/uL 298.0  264.0  273       Latest Ref Rng & Units 07/31/2023    9:21 AM 07/29/2022    9:44 AM 10/15/2021    8:04 AM  CMP  Glucose 70 - 99 mg/dL 147  98  829   BUN 6 - 23 mg/dL 15  15  13    Creatinine 0.40 - 1.20 mg/dL 5.62  1.30  8.65   Sodium 135 - 145 mEq/L 140  137  143   Potassium 3.5 - 5.1 mEq/L 4.0  3.9  4.2   Chloride 96 - 112 mEq/L 103  100  109   CO2 19 - 32 mEq/L 30  30  18    Calcium 8.4 - 10.5 mg/dL 9.6  78.4  9.9   Total Protein 6.0 - 8.3 g/dL 7.8  7.7    Total Bilirubin 0.2 - 1.2 mg/dL 0.7  0.6    Alkaline Phos 39 - 117 U/L 76  71    AST 0 - 37 U/L 18  17    ALT 0 - 35 U/L 19  19        Imaging: Radiological images reviewed:   Within last 24 hrs: No results found.  Assessment    Back mass, consistent with benign lipomatous tissue on exam. 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  Pros and cons discussed regarding each.  Observation also an option.  Further workup was not entertained at this point in time.  She seems a bit reluctant to proceed with excision, or deciding on presenting to the OR at this time.  She would like a week to consider her options.  Will be glad to schedule her procedure or continue observation as she desires.   Face-to-face time spent with the patient and accompanying care providers(if present) was 25 minutes, spent counseling, educating, and coordinating care of the patient.    These notes generated with voice recognition software. I apologize for typographical errors.  Campbell Lerner M.D., FACS 08/10/2023, 10:31 AM

## 2023-08-10 ENCOUNTER — Telehealth: Payer: Self-pay

## 2023-08-10 ENCOUNTER — Encounter: Payer: Self-pay | Admitting: Surgery

## 2023-08-10 ENCOUNTER — Ambulatory Visit (INDEPENDENT_AMBULATORY_CARE_PROVIDER_SITE_OTHER): Payer: Managed Care, Other (non HMO) | Admitting: Surgery

## 2023-08-10 VITALS — BP 181/94 | HR 99 | Temp 98.0°F | Ht 66.0 in | Wt 217.2 lb

## 2023-08-10 DIAGNOSIS — D171 Benign lipomatous neoplasm of skin and subcutaneous tissue of trunk: Secondary | ICD-10-CM

## 2023-08-10 DIAGNOSIS — R222 Localized swelling, mass and lump, trunk: Secondary | ICD-10-CM

## 2023-08-10 NOTE — Telephone Encounter (Signed)
 Copied from CRM [id number]. Topic: Clinical - Medication Question >> Aug 10, 2023 12:22 PM Gurney Maxin H wrote: Reason for CRM: Patient states she was accidentally taking both old hydrochlorothiazide (MICROZIDE) 12.5 MG capsule and new triamterene-hydrochlorothiazide (MAXZIDE-25) 37.5-25 MG table blood pressure medication for about 7 or 8 days and wants to know if she will be okay. States she thought she took old medication out of pill box but didn't. Please reach out to patient, thanks.  Jolly [phone]

## 2023-08-10 NOTE — Telephone Encounter (Signed)
 Spoke to pt. She has gotten rid of the old medication.

## 2023-08-10 NOTE — Patient Instructions (Signed)
Excision of Skin Lesions Excision of a skin lesion is the removal of a section of skin by making small incisions in the skin. Through this process, the lesion is completely removed. This procedure is often done to treat or prevent cancer or infection. It may also be done to improve cosmetic appearance. You may have this procedure to remove: Cancerous (malignant) growths, such as basal cell carcinoma, squamous cell carcinoma, or melanoma. Noncancerous (benign) growths, such as a cyst or lipoma. Growths, such as moles or skin tags, which may be removed for cosmetic reasons. Various excision or surgical techniques may be used depending on your condition, the location of the lesion, and your overall health. Tell your health care provider about: Any allergies you have. All medicines you are taking, including vitamins, herbs, eye drops, creams, and over-the-counter medicines. Any problems you or family members have had with anesthetic medicines. Any bleeding problems you have. Any surgeries you have had. Any medical conditions you have. Whether you are pregnant or may be pregnant. What are the risks? Generally, this is a safe procedure. However, problems may occur, including: Bleeding. Infection. Scarring. Recurrence of the cyst, lipoma, or cancer. Allergic reaction to anesthetics, surgical materials, or ointments. Damage to nerves, blood vessels, muscles, or other structures. What happens before the procedure? Medicines Ask your health care provider about: Changing or stopping your regular medicines. This is especially important if you are taking diabetes medicines or blood thinners. Taking medicines such as aspirin and ibuprofen. These medicines can thin your blood. Do not take these medicines unless your health care provider tells you to take them. Taking over-the-counter medicines, vitamins, herbs, and supplements. General instructions Do not use any products that contain nicotine or  tobacco. These products include cigarettes, chewing tobacco, and vaping devices, such as e-cigarettes. If you need help quitting, ask your health care provider. Follow instructions from your health care provider about eating or drinking restrictions. Ask your health care provider: How your surgery site will be marked. What steps will be taken to help prevent infection. These steps may include: Removing hair at the surgery site. Washing skin with a germ-killing soap. Taking antibiotic medicine. Ask your health care provider if you will need someone to take you home from the hospital or clinic after the procedure. What happens during the procedure?  You will be given a medicine to numb the area (local anesthetic). Your health care provider will remove the lesions using one of the following excision techniques. Complete surgical excision. This procedure may be done to treat a cancerous growth or a noncancerous cyst or lesion. A small scalpel or scissors will be used to gently cut around and under the lesion until it is completely removed. If bleeding occurs, it will be stopped with a device that delivers heat (electrocautery). The edges of the wound may be stitched (sutured) together. A bandage (dressing) will be applied. Samples will be sent to a lab for testing. Excision of a cyst. An incision will be made on the cyst. The entire cyst will be removed through the incision. The incision may be closed with sutures. Shave excision. This may be done to remove a mole or other small growths. A small blade or scalpel will be used to shave off the lesion. The wound is usually left to heal on its own without sutures. The sample may be sent to a lab for testing. Punch excision. This may be done to completely remove a mole or other small growths. A small tool that  is like a cookie cutter or a hole punch is used to cut a circle shape out of the skin. The outer edges of the skin will be sutured  together. The sample may be sent to a lab for testing. Mohs micrographic surgery. This is usually done to treat skin cancer. This type of excision is mostly used on the face and ears. This procedure is minimally invasive, and it ensures the best cosmetic outcome. A scalpel or a loop instrument will be used to remove layers of the lesion until all the abnormal or cancerous tissue has been removed. The wound may be sutured, depending on its size. The tissue will be checked under a microscope right away. The procedure may vary among health care providers and hospitals. At the end of any of these procedures, antibiotic ointment will be applied as needed. What happens after the procedure? Talk with your health care provider to discuss any test results, treatment options, and if necessary, the need for more tests. Keep all follow-up visits. This is important. Summary Excision of a skin lesion is the removal of a section of skin by making small incisions in the skin. This procedure is often done to treat or prevent skin cancer, remove benign growths, or it may be done to improve cosmetic appearance. Various excision or surgical techniques may be used depending on your condition, the location of the lesion, and your overall health. After the procedure, talk with your health care provider to discuss any test results, treatment options, and if necessary, the need for more tests. Keep all follow-up visits. This is important. This information is not intended to replace advice given to you by your health care provider. Make sure you discuss any questions you have with your health care provider. Document Revised: 12/29/2020 Document Reviewed: 12/29/2020 Elsevier Patient Education  2024 [employer].

## 2023-09-03 ENCOUNTER — Encounter: Payer: Self-pay | Admitting: Internal Medicine

## 2023-09-03 DIAGNOSIS — R222 Localized swelling, mass and lump, trunk: Secondary | ICD-10-CM

## 2024-01-26 ENCOUNTER — Encounter: Payer: Self-pay | Admitting: Internal Medicine

## 2024-03-26 ENCOUNTER — Ambulatory Visit

## 2024-03-26 VITALS — BP 138/88 | HR 97 | Temp 97.7°F | Ht 66.0 in | Wt 223.0 lb

## 2024-03-26 DIAGNOSIS — M8580 Other specified disorders of bone density and structure, unspecified site: Secondary | ICD-10-CM | POA: Diagnosis not present

## 2024-03-26 DIAGNOSIS — Z23 Encounter for immunization: Secondary | ICD-10-CM | POA: Diagnosis not present

## 2024-03-26 DIAGNOSIS — H9311 Tinnitus, right ear: Secondary | ICD-10-CM

## 2024-03-26 DIAGNOSIS — L853 Xerosis cutis: Secondary | ICD-10-CM | POA: Diagnosis not present

## 2024-03-26 MED ORDER — TRIAMCINOLONE ACETONIDE 0.1 % EX CREA: 1.0000 | TOPICAL_CREAM | Freq: Two times a day (BID) | CUTANEOUS | 1 refills | Status: DC | PRN

## 2024-03-26 NOTE — Progress Notes (Signed)
 Subjective:   This visit was conducted in person. The patient gave informed consent to the use of Abridge AI technology to record the contents of the encounter as documented below.   Patient ID: Tanya Gomez, female    DOB: 08/13/1955, 68 y.o.   MRN: [id number]  History of Present Illness Tanya Gomez is a 68 year old female who presents for follow-up of chronic conditions.  She experiences persistent ringing in her right ear, described as a 'fuzzy' sound, which has been ongoing for years. Despite a previous recommendation to pursue a hearing aid, she has not done so. She continues to experience hearing difficulties and static noise in the right ear. Her left ear remains unaffected.  She was informed by her gynecologist about low bone density and was advised to have a bone density test last year, which she did not complete. She plans to follow up with her gynecologist in November for further evaluation. She has not been taking calcium  or vitamin D  supplements.  She has a history of dry skin on her hands. She uses Kenalog  cream for flare-ups, which clears the symptoms quickly.    Review of Systems  Gastrointestinal:  Abdominal distention: dry skin.  All other systems reviewed and are negative.       No Known Allergies  Current Outpatient Medications on File Prior to Visit  Medication Sig Dispense Refill   fluticasone  (FLONASE ) 50 MCG/ACT nasal spray Place 2 sprays into both nostrils daily. (Patient taking differently: Place 2 sprays into both nostrils as needed.) 16 g 6   Multiple Vitamin (MULTIVITAMIN) capsule Take 1 capsule by mouth daily.     triamterene -hydrochlorothiazide  (MAXZIDE-25) 37.5-25 MG tablet Take 1 tablet by mouth daily. 90 tablet 3   No current facility-administered medications on file prior to visit.    BP 138/88 (BP Location: Left Arm, Patient Position: Sitting, Cuff Size: Large)   Pulse 97   Temp 97.7 F (36.5 C) (Oral)   Ht 5' 6 (1.676 m)   Wt 223  lb (101.2 kg)   SpO2 96%   BMI 35.99 kg/m   Objective:      Physical Exam  GENERAL: Alert, cooperative, well developed, no acute distress. HEAD: Normocephalic atraumatic. EXTREMITIES: No cyanosis or edema. NEUROLOGICAL: Oriented to person, place and time, no gait abnormalities, moves all extremities without gross motor or sensory deficit. SKIN: Dry scaly skin on medial palmar surfaces bilaterally.       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 No recent hearing test on file. Previous recommendation for hearing aid not yet pursued. Prefers audiology services in [place] due to work location.  - Refer to audiology for hearing test and potential hearing aid fitting at [facility] in [place].  Osteopenia, unspecified location Per chart review, patient has osteopenia, most recent DEXA was in 2022, she is due for repeat. Awaiting upcoming gynecological appointment for potential bone density test order. Discussed importance of calcium  and vitamin D  supplementation for bone health.  Vitamin D  level ordered, if deficient, will treat accordingly.  - Order vitamin D  level test to assess for deficiency. - She will follow-up with gynecologist in November for bone density test.   Dry skin of hands Dry patches of skin on the palmar surface of both hands, likely due to frequent hand washing and environmental factors. Previous use of triamcinolone  cream deemed unnecessary given that I do not suspect that  this is truly a dermatitis, counseled patient on risks of side effects including skin thinning, atrophy, local skin reaction and hypopigmentation, she is agreeable to discontinue.  - Discontinue triamcinolone  cream. - Recommend use of emollient lotion such as Vaseline or Jergens for dry patches on hands.  Need for influenza vaccine Flu vaccine given this visit   Return in  about 18 weeks (around 07/30/2024) for CPE. Pls schedule for fasting lab 1 wk from now.   Kavina Cantave K Layne Dilauro, MD  03/26/24     Contains text generated by Abridge.

## 2024-03-26 NOTE — Patient Instructions (Addendum)
 Thank you for visiting [facility] today! Here's what we talked about: - Start using either vaseline/jergens lotion on the dry patch on hands - Audiology will call you - Come back in 1 week for fasting lab

## 2024-04-02 ENCOUNTER — Other Ambulatory Visit (INDEPENDENT_AMBULATORY_CARE_PROVIDER_SITE_OTHER)

## 2024-04-02 DIAGNOSIS — M8580 Other specified disorders of bone density and structure, unspecified site: Secondary | ICD-10-CM

## 2024-04-02 LAB — VITAMIN D 25 HYDROXY (VIT D DEFICIENCY, FRACTURES): VITD: 32.35 ng/mL (ref 30.00–100.00)

## 2024-04-03 ENCOUNTER — Ambulatory Visit: Payer: Self-pay

## 2024-04-25 ENCOUNTER — Ambulatory Visit: Admitting: Radiology

## 2024-05-02 ENCOUNTER — Ambulatory Visit: Admitting: Audiology

## 2024-05-02 DIAGNOSIS — H903 Sensorineural hearing loss, bilateral: Secondary | ICD-10-CM | POA: Diagnosis present

## 2024-05-03 NOTE — Procedures (Signed)
  Outpatient [facility] and [facility] [address] [place], [place]  [address] [phone]  AUDIOLOGICAL  EVALUATION  NAME: Tanya Gomez     DOB:   1956-04-04      MRN: [id number]                                                                                     DATE: 05/03/2024     REFERENT: Bennett Reuben POUR, MD STATUS: Outpatient DIAGNOSIS: sensorineural hearing loss, bilateral    History: Shantese was seen for an audiological evaluation due to concerns regarding tinnitus in the right ear. Tanya Gomez reports a history of decreased hearing in the right ear for many years. She reports significant tinnitus and ringing in her right ear. Tanya Gomez denies otalgia. She reports some aural fullness in the right ear. Tanya Gomez denies hearing concerns regarding her left ear. Tanya Gomez reports she previously saw [facility] many years ago and has not followed up with ENT recently.   Evaluation:  Otoscopy showed a clear view of the tympanic membranes, bilaterally Tympanometry results were consistent with normal middle ear function (Type A) bilaterally.  Audiometric testing was completed using Conventional Audiometry techniques with insert earphones and TDH headphones. Test  results are consistent in the left ear with normal hearing sensitivity sloping to a moderate sensorineural hearing loss and in the right ear with a severe to profound sensorineural hearing loss. There is a sensorineural asymmetry noted worse in the right ear at all frequencies tested. Speech Recognition Thresholds were obtained at 85 dB HL in the right ear and at 20  dB HL in the left ear. Word Recognition Testing was completed at 90 dB HL and Likisha scored 12% in the right ear and was completed at 60 dB HL and Tanya Gomez scored 100% in the left ear.  Asymmetric word recognition noted.   Results:  The test results were reviewed with Tanya Gomez. Today's test results are consistent in the left ear with normal  hearing sensitivity sloping to a moderate sensorineural hearing loss and in the right ear with a severe to profound sensorineural hearing loss. There is a sensorineural asymmetry noted worse in the right ear and asymmetric word recognition noted, worse in the right ear. Tanya Gomez will have hearing and communication difficulty in many listening environments. She will benefit from the use of good communication strategies and the use of amplification. Tanya Gomez was encouraged to follow up with ENT for further evaluation of asymmetric hearing loss. Tanya Gomez was given a list of hearing aid providers in the [place] are.   Recommendations: Referral to an Ear, Nose, and Throat Physician to re-establish care for asymmetric hearing loss and right tinnitus. Communication Needs Assessment with an Audiologist to further discuss amplification and hearing aid options Monitor hearing sensitivity    30 minutes spent testing and counseling on results.   If you have any questions please feel free to contact me at [phone].  Darryle Posey Audiologist, Au.D., CCC-A 05/03/2024  12:17 PM  Cc: Bowa, Nahosi K, MD

## 2024-05-27 LAB — HM MAMMOGRAPHY

## 2024-05-30 ENCOUNTER — Ambulatory Visit: Payer: Self-pay

## 2024-05-30 ENCOUNTER — Encounter: Payer: Self-pay | Admitting: Internal Medicine

## 2024-06-27 ENCOUNTER — Ambulatory Visit (INDEPENDENT_AMBULATORY_CARE_PROVIDER_SITE_OTHER): Admitting: Family Medicine

## 2024-06-27 VITALS — BP 122/70 | HR 83 | Temp 98.1°F | Ht 66.0 in | Wt 220.1 lb

## 2024-06-27 DIAGNOSIS — J0100 Acute maxillary sinusitis, unspecified: Secondary | ICD-10-CM | POA: Diagnosis not present

## 2024-06-27 MED ORDER — AMOXICILLIN-POT CLAVULANATE 875-125 MG PO TABS: 1.0000 | ORAL_TABLET | Freq: Two times a day (BID) | ORAL | 0 refills | Status: AC

## 2024-06-27 NOTE — Patient Instructions (Addendum)
 Get flonase  (store brand) and use 2 sprays in each nostril once daily   Take the augmentin  for bacterial sinus infection   Also get nasal saline spray (simply saline) to help clear sinuses out  Breath steam when you can   Ibuprofen as needed (advil) can help sinus pain

## 2024-06-27 NOTE — Assessment & Plan Note (Signed)
 1 week of congestion/cough and increasing maxillary facial pain -worse on the left /also left ear pain and purulent mucous Had run out of flonase  prior -perhaps that began the congestion  Suspect bacterial at this point Will treat with augmentin   Pt will get back on flonase  over the counter  Nasal saline may also help  Nsaid for pain   Disc symptomatic care - see instructions on AVS Update if not starting to improve in a week or if worsening  Call back and Er precautions noted in detail today   Handouts given

## 2024-06-27 NOTE — Progress Notes (Signed)
 "  Subjective:    Patient ID: Tanya Gomez, female    DOB: 03/25/1956, 69 y.o.   MRN: [id number]  HPI  Wt Readings from Last 3 Encounters:  06/27/24 220 lb 2 oz (99.8 kg)  03/26/24 223 lb (101.2 kg)  08/10/23 217 lb 3.2 oz (98.5 kg)   35.53 kg/m  Vitals:   06/27/24 0947 06/27/24 1004  BP: (!) 146/84 122/70  Pulse: 83   Temp: 98.1 F (36.7 C)   SpO2: 98%     69 yo pt of Dr Bennett presents with  Sinus symptoms and cough   Started last Friday  Lost voice  Cough -phlegm , green  No wheeze No shortness of breath  No fever  Did not do covid test  Nasal congestion - stuffy/clogged up - green mucous also   Ears -left on is sore  No pressure or popping  Throat  -fine /not more than scratchy    Yesterday started getting pain in left cheek   Scalp on left is tender No rash at all    Over the counter Advil cold and sinus    Flonase  ns - is out of     Patient Active Problem List   Diagnosis Date Noted   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   Mass of subcutaneous tissue of back 08/23/2022   Osteopenia 01/04/2016   Allergic rhinitis 08/14/2015   Acute sinusitis 10/11/2013   Obesity 04/11/2012   Psoriasis 04/12/2011   Essential hypertension, benign 10/11/2010   POSTMENOPAUSAL STATUS 04/29/2009   Tinnitus 01/21/2009   Hyperlipemia 10/05/2007   Osteoarthritis 10/05/2007   H/O adenomatous polyp of colon 10/05/2007   Past Medical History:  Diagnosis Date   History of shingles    Hx of colonic polyps    adenomatous   Hyperlipidemia    Hypertension    Osteoarthritis    in both knees   Osteopenia 01/2018   T score -1.3 FRAX 7.5% / 0.6%   Past Surgical History:  Procedure Laterality Date   COLONOSCOPY     JOINT REPLACEMENT  07/02/2021   Knee replacement   PATELLAR TENDON REPAIR  06/13/1997   left, spurs removed   REPLACEMENT TOTAL KNEE BILATERAL  2023   TOTAL KNEE ARTHROPLASTY     right 1/23, left 5/23   TUBAL LIGATION     VAGINAL DELIVERY     x2   Social  History[1] Family History  Problem Relation Age of Onset   Diabetes Mother    Stroke Mother    Peripheral vascular disease Mother    Coronary artery disease Father    Diabetes Father    Heart disease Father    Stroke Father    Brain cancer Maternal Grandmother    Cancer Maternal Grandmother        brain   Allergies[2] Medications Ordered Prior to Encounter[3]  Review of Systems  Constitutional:  Negative for appetite change, fatigue and fever.  HENT:  Positive for congestion, ear pain, postnasal drip, rhinorrhea, sinus pressure, sinus pain and sore throat. Negative for nosebleeds.   Eyes:  Negative for pain, redness and itching.  Respiratory:  Positive for cough. Negative for shortness of breath and wheezing.   Cardiovascular:  Negative for chest pain.  Gastrointestinal:  Negative for abdominal pain, diarrhea, nausea and vomiting.  Endocrine: Negative for polyuria.  Genitourinary:  Negative for dysuria, frequency and urgency.  Musculoskeletal:  Negative for arthralgias and myalgias.  Allergic/Immunologic: Negative for immunocompromised state.  Neurological:  Positive for headaches. Negative  for dizziness, tremors, syncope, weakness and numbness.  Hematological:  Negative for adenopathy. Does not bruise/bleed easily.  Psychiatric/Behavioral:  Negative for dysphoric mood. The patient is not nervous/anxious.        Objective:   Physical Exam Constitutional:      General: She is not in acute distress.    Appearance: Normal appearance. She is well-developed. She is obese. She is not ill-appearing.  HENT:     Head: Normocephalic and atraumatic.     Comments: Bilateral maxillary sinus tenderness worse on left     Right Ear: Tympanic membrane and external ear normal.     Left Ear: Tympanic membrane and external ear normal.     Ears:     Comments: Left TM is dull No erythema or bulging     Nose: Congestion and rhinorrhea present.     Mouth/Throat:     Pharynx: Oropharynx is  clear. No oropharyngeal exudate or posterior oropharyngeal erythema.     Comments: Clear pnd Eyes:     General:        Right eye: No discharge.        Left eye: No discharge.     Conjunctiva/sclera: Conjunctivae normal.     Pupils: Pupils are equal, round, and reactive to light.  Cardiovascular:     Rate and Rhythm: Normal rate and regular rhythm.  Pulmonary:     Effort: Pulmonary effort is normal. No respiratory distress.     Breath sounds: Normal breath sounds. No stridor. No wheezing, rhonchi or rales.     Comments: Good air exch No rales or rhonchi Musculoskeletal:     Cervical back: Normal range of motion and neck supple.  Lymphadenopathy:     Cervical: No cervical adenopathy.  Skin:    General: Skin is warm and dry.     Findings: No rash.  Neurological:     Mental Status: She is alert.     Cranial Nerves: No cranial nerve deficit.     Coordination: Coordination normal.  Psychiatric:        Mood and Affect: Mood normal.           Assessment & Plan:   Problem List Items Addressed This Visit       Respiratory   Acute sinusitis - Primary   1 week of congestion/cough and increasing maxillary facial pain -worse on the left /also left ear pain and purulent mucous Had run out of flonase  prior -perhaps that began the congestion  Suspect bacterial at this point Will treat with augmentin   Pt will get back on flonase  over the counter  Nasal saline may also help  Nsaid for pain   Disc symptomatic care - see instructions on AVS Update if not starting to improve in a week or if worsening  Call back and Er precautions noted in detail today   Handouts given       Relevant Medications   amoxicillin -clavulanate (AUGMENTIN ) 875-125 MG tablet      [1]  Social History Tobacco Use   Smoking status: Never    Passive exposure: Past (Parents were smokers)   Smokeless tobacco: Never  Vaping Use   Vaping status: Never Used  Substance Use Topics   Alcohol use: Yes     Alcohol/week: 0.0 standard drinks of alcohol    Comment: occasional   Drug use: No  [2] No Known Allergies [3]  Current Outpatient Medications on File Prior to Visit  Medication Sig Dispense Refill   atorvastatin  (LIPITOR) 20  MG tablet Take 20 mg by mouth daily.     fluticasone  (FLONASE ) 50 MCG/ACT nasal spray Place 2 sprays into both nostrils daily. (Patient taking differently: Place 2 sprays into both nostrils as needed.) 16 g 6   Multiple Vitamin (MULTIVITAMIN) capsule Take 1 capsule by mouth daily.     triamterene -hydrochlorothiazide  (MAXZIDE-25) 37.5-25 MG tablet Take 1 tablet by mouth daily. 90 tablet 3   No current facility-administered medications on file prior to visit.   "

## 2024-07-05 ENCOUNTER — Ambulatory Visit (INDEPENDENT_AMBULATORY_CARE_PROVIDER_SITE_OTHER): Admitting: Radiology

## 2024-07-05 ENCOUNTER — Encounter: Payer: Self-pay | Admitting: Radiology

## 2024-07-05 ENCOUNTER — Other Ambulatory Visit (HOSPITAL_COMMUNITY)
Admission: RE | Admit: 2024-07-05 | Discharge: 2024-07-05 | Disposition: A | Source: Ambulatory Visit | Attending: Radiology | Admitting: Radiology

## 2024-07-05 VITALS — BP 124/80 | HR 81 | Ht 65.75 in | Wt 220.0 lb

## 2024-07-05 DIAGNOSIS — R319 Hematuria, unspecified: Secondary | ICD-10-CM

## 2024-07-05 DIAGNOSIS — M8580 Other specified disorders of bone density and structure, unspecified site: Secondary | ICD-10-CM | POA: Diagnosis not present

## 2024-07-05 DIAGNOSIS — Z1331 Encounter for screening for depression: Secondary | ICD-10-CM

## 2024-07-05 DIAGNOSIS — Z01419 Encounter for gynecological examination (general) (routine) without abnormal findings: Secondary | ICD-10-CM

## 2024-07-05 LAB — URINALYSIS, COMPLETE W/RFL CULTURE
Bacteria, UA: NONE SEEN /HPF
Bilirubin Urine: NEGATIVE
Glucose, UA: NEGATIVE
Hgb urine dipstick: NEGATIVE
Hyaline Cast: NONE SEEN /LPF
Ketones, ur: NEGATIVE
Leukocyte Esterase: NEGATIVE
Nitrites, Initial: NEGATIVE
Protein, ur: NEGATIVE
RBC / HPF: NONE SEEN /HPF (ref 0–2)
Specific Gravity, Urine: 1.015 (ref 1.001–1.035)
WBC, UA: NONE SEEN /HPF (ref 0–5)
pH: 6.5 (ref 5.0–8.0)

## 2024-07-05 LAB — NO CULTURE INDICATED

## 2024-07-05 NOTE — Progress Notes (Signed)
" ° °  Tanya Gomez 29-Nov-1955 [id number]   History: Postmenopausal 69 y.o. presents for annual exam. Has had 2 episodes of hematuria without pain over the past couple of months. No other gyn concerns.   Gynecologic History Postmenopausal Last Pap: 2023. Results were: normal Last mammogram: 2025. Results were: normal Last colonoscopy: 2016 DEXA:2022   Obstetric History OB History  Gravida Para Term Preterm AB Living  4 4 2   2   SAB IAB Ectopic Multiple Live Births          # Outcome Date GA Lbr Len/2nd Weight Sex Type Anes PTL Lv  4 Term           3 Term           2 Para      Vag-Spont     1 Para      Vag-Spont         07/05/2024   11:14 AM 06/27/2024    9:54 AM 03/26/2024    3:42 PM  Depression screen PHQ 2/9  Decreased Interest 0 0 0  Down, Depressed, Hopeless 0 0 0  PHQ - 2 Score 0 0 0     The following portions of the patient's history were reviewed and updated as appropriate: allergies, current medications, past family history, past medical history, past social history, past surgical history, and problem list.  Review of Systems Pertinent items noted in HPI and remainder of comprehensive ROS otherwise negative.  Past medical history, past surgical history, family history and social history were all reviewed and documented in the EPIC chart.  Exam:  Vitals:   07/05/24 1112  BP: 124/80  Pulse: 81  SpO2: 98%  Weight: 220 lb (99.8 kg)  Height: 5' 5.75 (1.67 m)   Body mass index is 35.78 kg/m.  General appearance:  Normal Thyroid:  Symmetrical, normal in size, without palpable masses or nodularity. Respiratory  Auscultation:  Clear without wheezing or rhonchi Cardiovascular  Auscultation:  Regular rate, without rubs, murmurs or gallops  Edema/varicosities:  Not grossly evident Abdominal  Soft,nontender, without masses, guarding or rebound.  Liver/spleen:  No organomegaly noted  Hernia:  None appreciated  Skin  Inspection:  Grossly normal Breasts:  Examined lying and sitting.   Right: Without masses, retractions, nipple discharge or axillary adenopathy.   Left: Without masses, retractions, nipple discharge or axillary adenopathy. Genitourinary   Inguinal/mons:  Normal without inguinal adenopathy  External genitalia:  Normal appearing vulva with no masses, tenderness, or lesions  BUS/Urethra/Skene's glands:  Normal  Vagina:  Normal appearing with normal color and discharge, no lesions. Atrophy: mild   Cervix:  Normal appearing without discharge or lesions  Uterus:  Normal in size, shape and contour.  Midline and mobile, nontender  Adnexa/parametria:     Rt: Normal in size, without masses or tenderness.   Lt: Normal in size, without masses or tenderness.  Anus and perineum: Normal    Darice Hoit, CMA present for exam  Assessment/Plan:   1. Well woman exam with routine gynecological exam (Primary) - Cytology - PAP( )  2. Osteopenia, unspecified location DEXA ordered at [facility]  3. Hematuria, unspecified type - Urinalysis,Complete w/RFL Culture  4. Depression screen   Discussed SBE, colonoscopy and DEXA screening as directed. Recommend of exercise weekly, including weight bearing exercise. Encouraged the use of seatbelts and sunscreen.  Return in 1 year for annual or sooner prn.  Rielle Schlauch B WHNP-BC, 11:20 AM 07/05/2024  "

## 2024-07-09 LAB — CYTOLOGY - PAP
Comment: NEGATIVE
Diagnosis: NEGATIVE
High risk HPV: NEGATIVE

## 2024-07-10 ENCOUNTER — Ambulatory Visit: Payer: Self-pay | Admitting: Radiology

## 2024-07-25 ENCOUNTER — Other Ambulatory Visit

## 2024-08-01 ENCOUNTER — Encounter
